# Patient Record
Sex: Female | Born: 1955 | Race: White | Hispanic: Yes | Marital: Married | Smoking: Never smoker
Health system: Southern US, Community
[De-identification: ages and names within clinical notes are randomized; demographics above are authoritative.]

## PROBLEM LIST (undated history)

## (undated) DIAGNOSIS — D649 Anemia, unspecified: Secondary | ICD-10-CM

## (undated) DIAGNOSIS — M1990 Unspecified osteoarthritis, unspecified site: Secondary | ICD-10-CM

## (undated) DIAGNOSIS — I10 Essential (primary) hypertension: Secondary | ICD-10-CM

## (undated) DIAGNOSIS — Z86010 Personal history of colonic polyps: Secondary | ICD-10-CM

## (undated) DIAGNOSIS — Z5189 Encounter for other specified aftercare: Secondary | ICD-10-CM

## (undated) DIAGNOSIS — E785 Hyperlipidemia, unspecified: Secondary | ICD-10-CM

## (undated) DIAGNOSIS — M810 Age-related osteoporosis without current pathological fracture: Secondary | ICD-10-CM

## (undated) DIAGNOSIS — K649 Unspecified hemorrhoids: Secondary | ICD-10-CM

## (undated) HISTORY — DX: Hyperlipidemia, unspecified: E78.5

## (undated) HISTORY — PX: COLONOSCOPY: SHX174

## (undated) HISTORY — DX: Encounter for other specified aftercare: Z51.89

## (undated) HISTORY — DX: Essential (primary) hypertension: I10

## (undated) HISTORY — DX: Personal history of colonic polyps: Z86.010

## (undated) HISTORY — PX: POLYPECTOMY: SHX149

## (undated) HISTORY — DX: Unspecified osteoarthritis, unspecified site: M19.90

## (undated) HISTORY — DX: Age-related osteoporosis without current pathological fracture: M81.0

## (undated) HISTORY — DX: Anemia, unspecified: D64.9

## (undated) HISTORY — DX: Unspecified hemorrhoids: K64.9

---

## 1993-11-06 HISTORY — PX: TUBAL LIGATION: SHX77

## 2007-11-07 HISTORY — PX: VAGINAL HYSTERECTOMY: SUR661

## 2008-01-20 ENCOUNTER — Inpatient Hospital Stay (HOSPITAL_COMMUNITY): Admission: EM | Admit: 2008-01-20 | Discharge: 2008-01-21 | Payer: Self-pay | Admitting: Emergency Medicine

## 2008-02-18 ENCOUNTER — Encounter: Admission: RE | Admit: 2008-02-18 | Discharge: 2008-02-18 | Payer: Self-pay | Admitting: Obstetrics and Gynecology

## 2008-03-11 ENCOUNTER — Encounter (INDEPENDENT_AMBULATORY_CARE_PROVIDER_SITE_OTHER): Payer: Self-pay | Admitting: Obstetrics and Gynecology

## 2008-03-11 ENCOUNTER — Inpatient Hospital Stay (HOSPITAL_COMMUNITY): Admission: RE | Admit: 2008-03-11 | Discharge: 2008-03-12 | Payer: Self-pay | Admitting: Obstetrics and Gynecology

## 2008-11-06 HISTORY — PX: WISDOM TOOTH EXTRACTION: SHX21

## 2009-02-08 ENCOUNTER — Encounter: Admission: RE | Admit: 2009-02-08 | Discharge: 2009-02-08 | Payer: Self-pay | Admitting: Obstetrics and Gynecology

## 2009-11-06 HISTORY — PX: KNEE ARTHROSCOPY: SHX127

## 2010-09-22 ENCOUNTER — Ambulatory Visit (HOSPITAL_COMMUNITY): Admission: RE | Admit: 2010-09-22 | Discharge: 2010-09-22 | Payer: Self-pay | Admitting: Orthopedic Surgery

## 2010-11-06 HISTORY — PX: KNEE ARTHROSCOPY: SUR90

## 2010-11-28 ENCOUNTER — Encounter
Admission: RE | Admit: 2010-11-28 | Discharge: 2010-11-28 | Payer: Self-pay | Source: Home / Self Care | Attending: Obstetrics and Gynecology | Admitting: Obstetrics and Gynecology

## 2010-11-28 ENCOUNTER — Encounter (INDEPENDENT_AMBULATORY_CARE_PROVIDER_SITE_OTHER): Payer: Self-pay | Admitting: *Deleted

## 2010-12-02 ENCOUNTER — Encounter
Admission: RE | Admit: 2010-12-02 | Discharge: 2010-12-02 | Payer: Self-pay | Source: Home / Self Care | Attending: Obstetrics and Gynecology | Admitting: Obstetrics and Gynecology

## 2010-12-08 NOTE — Letter (Signed)
Summary: Pre Visit Letter Revised  [facility]  [address] [place], [place] [address]   Phone: [phone]  Fax: [phone]        11/28/2010 MRN: [id number] [name] [address] [place] [address]                Procedure Date:  12/30/2010 @ 3:00   Direct colon-Dr. Leone Payor   Welcome to the Gastroenterology Division at [facility].    You are scheduled to see a nurse for your pre-procedure visit on 12/16/2010 at 3:30 on the 3rd floor at [facility], [address]  We ask that you try to arrive at our office 15 minutes prior to your appointment time to allow for check-in.  Please take a minute to review the attached form.  If you answer "Yes" to one or more of the questions on the first page, we ask that you call the person listed at your earliest opportunity.  If you answer "No" to all of the questions, please complete the rest of the form and bring it to your appointment.    Your nurse visit will consist of discussing your medical and surgical history, your immediate family medical history, and your medications.   If you are unable to list all of your medications on the form, please bring the medication bottles to your appointment and we will list them.  We will need to be aware of both prescribed and over the counter drugs.  We will need to know exact dosage information as well.    Please be prepared to read and sign documents such as consent forms, a financial agreement, and acknowledgement forms.  If necessary, and with your consent, a friend or relative is welcome to sit-in on the nurse visit with you.  Please bring your insurance card so that we may make a copy of it.  If your insurance requires a referral to see a specialist, please bring your referral form from your primary care physician.  No co-pay is required for this nurse visit.     If you cannot keep your appointment, please call [phone] to cancel or reschedule prior to  your appointment date.  This allows [place] the opportunity to schedule an appointment for another patient in need of care.    Thank you for choosing [facility] for your medical needs.  We appreciate the opportunity to care for you.  Please visit [place] at our website  to learn more about our practice.  Sincerely, The Gastroenterology Division

## 2010-12-08 NOTE — Letter (Signed)
Summary: Pre Visit Letter Revised  [facility]  [address] [place], [place] [address]   Phone: [phone]  Fax: [phone]        11/28/2010 MRN: [id number] [name]               Procedure Date:  12/30/2010 @ 3:00   Direct colon-Dr. Leone Payor   Welcome to the Gastroenterology Division at [facility].    You are scheduled to see a nurse for your pre-procedure visit on 12/16/2010 at 3:30 on the 3rd floor at [facility], [address].  We ask that you try to arrive at our office 15 minutes prior to your appointment time to allow for check-in.  Please take a minute to review the attached form.  If you answer "Yes" to one or more of the questions on the first page, we ask that you call the person listed at your earliest opportunity.  If you answer "No" to all of the questions, please complete the rest of the form and bring it to your appointment.    Your nurse visit will consist of discussing your medical and surgical history, your immediate family medical history, and your medications.   If you are unable to list all of your medications on the form, please bring the medication bottles to your appointment and we will list them.  We will need to be aware of both prescribed and over the counter drugs.  We will need to know exact dosage information as well.    Please be prepared to read and sign documents such as consent forms, a financial agreement, and acknowledgement forms.  If necessary, and with your consent, a friend or relative is welcome to sit-in on the nurse visit with you.  Please bring your insurance card so that we may make a copy of it.  If your insurance requires a referral to see a specialist, please bring your referral form from your primary care physician.  No co-pay is required for this nurse visit.     If you cannot keep your appointment, please call [phone] to cancel or reschedule prior to your appointment date.  This allows [place] the  opportunity to schedule an appointment for another patient in need of care.    Thank you for choosing [facility] for your medical needs.  We appreciate the opportunity to care for you.  Please visit [place] at our website  to learn more about our practice.  Sincerely, The Gastroenterology Division

## 2010-12-14 ENCOUNTER — Encounter (INDEPENDENT_AMBULATORY_CARE_PROVIDER_SITE_OTHER): Payer: Self-pay | Admitting: *Deleted

## 2010-12-16 ENCOUNTER — Encounter: Payer: Self-pay | Admitting: Internal Medicine

## 2010-12-22 NOTE — Miscellaneous (Signed)
Summary: [facility] PV.Marland Kitchen  Clinical Lists Changes  Medications: Added new medication of MOVIPREP 100 GM  SOLR (PEG-KCL-NACL-NASULF-NA ASC-C) As per prep instructions. - Signed Rx of MOVIPREP 100 GM  SOLR (PEG-KCL-NACL-NASULF-NA ASC-C) As per prep instructions.;  #1 x 0;  Signed;  Entered by: Ezra Sites RN;  Authorized by: Iva Boop MD, Clementeen Graham;  Method used: Electronically to [facility]*, [address], [place], [place]  [address], Ph: [phone], Fax: [phone] Allergies: Added new allergy or adverse reaction of MORPHINE Added new allergy or adverse reaction of * LISINOPRIL Observations: Added new observation of NKA: F (12/16/2010 15:08)    Prescriptions: MOVIPREP 100 GM  SOLR (PEG-KCL-NACL-NASULF-NA ASC-C) As per prep instructions.  #1 x 0   Entered by:   Ezra Sites RN   Authorized by:   Iva Boop MD, [facility]   Signed by:   Ezra Sites RN on 12/16/2010   Method used:   Electronically to        [facility]* (retail)       [address]       [address]       [place], [place]  [address]       Ph: [phone]       Fax: [phone]   RxID:   [id number]

## 2010-12-22 NOTE — Letter (Signed)
Summary: Moviprep Instructions  [facility]  [address]   [place], [place] [address]   Phone: [phone]  Fax: [phone]       [name]    1956/04/04    MRN: [id number]        Procedure Day Dorna Bloom: Friday, 12-30-10     Arrival Time: 2:00 p.m.     Procedure Time: 3:00 p.m.     Location of Procedure:                    x [facility] (4th Floor)   PREPARATION FOR COLONOSCOPY WITH MOVIPREP   Starting 5 days prior to your procedure 12-25-10 do not eat nuts, seeds, popcorn, corn, beans, peas,  salads, or any raw vegetables.  Do not take any fiber supplements (e.g. Metamucil, Citrucel, and Benefiber).  THE DAY BEFORE YOUR PROCEDURE         DATE: 12-29-10  DAY: Thursday  1.  Drink clear liquids the entire day-NO SOLID FOOD  2.  Do not drink anything colored red or purple.  Avoid juices with pulp.  No orange juice.  3.  Drink at least 64 oz. (8 glasses) of fluid/clear liquids during the day to prevent dehydration and help the prep work efficiently.  CLEAR LIQUIDS INCLUDE: Water Jello Ice Popsicles Tea (sugar ok, no milk/cream) Powdered fruit flavored drinks Coffee (sugar ok, no milk/cream) Gatorade Juice: apple, white grape, white cranberry  Lemonade Clear bullion, consomm, broth Carbonated beverages (any kind) Strained chicken noodle soup Hard Candy                             4.  In the morning, mix first dose of MoviPrep solution:    Empty 1 Pouch A and 1 Pouch B into the disposable container    Add lukewarm drinking water to the top line of the container. Mix to dissolve    Refrigerate (mixed solution should be used within 24 hrs)  5.  Begin drinking the prep at 5:00 p.m. The MoviPrep container is divided by 4 marks.   Every 15 minutes drink the solution down to the next mark (approximately 8 oz) until the full liter is complete.   6.  Follow completed prep with 16 oz of clear liquid of your choice (Nothing red or purple).   Continue to drink clear liquids until bedtime.  7.  Before going to bed, mix second dose of MoviPrep solution:    Empty 1 Pouch A and 1 Pouch B into the disposable container    Add lukewarm drinking water to the top line of the container. Mix to dissolve    Refrigerate  THE DAY OF YOUR PROCEDURE      DATE: 12-30-10  DAY: Friday  Beginning at 10:00 a.m. (5 hours before procedure):         1. Every 15 minutes, drink the solution down to the next mark (approx 8 oz) until the full liter is complete.  2. Follow completed prep with 16 oz. of clear liquid of your choice.    3. You may drink clear liquids until 1:00 p.m. (2 HOURS BEFORE PROCEDURE).   MEDICATION INSTRUCTIONS  Unless otherwise instructed, you should take regular prescription medications with a small sip of water   as early as possible the morning of your procedure.         OTHER INSTRUCTIONS  You will need a responsible adult at least 55  years of age to accompany you and drive you home.   This person must remain in the waiting room during your procedure.  Wear loose fitting clothing that is easily removed.  Leave jewelry and other valuables at home.  However, you may wish to bring a book to read or  an iPod/MP3 player to listen to music as you wait for your procedure to start.  Remove all body piercing jewelry and leave at home.  Total time from sign-in until discharge is approximately 2-3 hours.  You should go home directly after your procedure and rest.  You can resume normal activities the  day after your procedure.  The day of your procedure you should not:   Drive   Make legal decisions   Operate machinery   Drink alcohol   Return to work  You will receive specific instructions about eating, activities and medications before you leave.    The above instructions have been reviewed and explained to me by   Ezra Sites RN  December 16, 2010 3:38 PM    I fully understand and can verbalize  these instructions _____________________________ Date _________

## 2010-12-30 ENCOUNTER — Other Ambulatory Visit (AMBULATORY_SURGERY_CENTER): Payer: Commercial Managed Care - PPO | Admitting: Internal Medicine

## 2010-12-30 ENCOUNTER — Other Ambulatory Visit: Payer: Self-pay | Admitting: Internal Medicine

## 2010-12-30 ENCOUNTER — Encounter: Payer: Self-pay | Admitting: Internal Medicine

## 2010-12-30 DIAGNOSIS — Z86010 Personal history of colonic polyps: Secondary | ICD-10-CM

## 2010-12-30 DIAGNOSIS — D126 Benign neoplasm of colon, unspecified: Secondary | ICD-10-CM

## 2010-12-30 DIAGNOSIS — Z1211 Encounter for screening for malignant neoplasm of colon: Secondary | ICD-10-CM

## 2010-12-30 HISTORY — DX: Personal history of colonic polyps: Z86.010

## 2011-01-03 NOTE — Procedures (Addendum)
Summary: Colonoscopy  Patient: [name] Note: All result statuses are Final unless otherwise noted.  Tests: (1) Colonoscopy (COL)   COL Colonoscopy           DONE     [facility]     [address]     [place], [place]  [address]           COLONOSCOPY PROCEDURE REPORT           PATIENT:  [name], [name]  MR#:  [id number]     BIRTHDATE:  Sep 28, 1956, 54 yrs. old  GENDER:  female     ENDOSCOPIST:  Iva Boop, MD, [facility]     REF. BY:  Jarome Matin, M.D.     PROCEDURE DATE:  12/30/2010     PROCEDURE:  Colonoscopy with biopsy and snare polypectomy     ASA CLASS:  Class II     INDICATIONS:  Routine Risk Screening     MEDICATIONS:   Fentanyl 50 mcg IV, Versed 8 mg IV           DESCRIPTION OF PROCEDURE:   After the risks benefits and     alternatives of the procedure were thoroughly explained, informed     consent was obtained.  Digital rectal exam was performed and     revealed no abnormalities.   The LB PCF-[id number] endoscope     was introduced through the anus and advanced to the cecum, which     was identified by both the appendix and ileocecal valve, without     limitations.  The quality of the prep was excellent, using     MoviPrep.  The instrument was then slowly withdrawn as the colon     was fully examined. Insertion: 2:52 minutes Withdrawal: 16:32     minutes     <<PROCEDUREIMAGES>>           FINDINGS:  Three polyps were found. Ileocecal valve (2mm, cold     biopsy removal), ascending colon (8-10 mm, snare cautery removal),     sigmoid (4mm, cold snare removal). All sent to pathology.  This     was otherwise a normal examination of the colon.   Retroflexed     views in the rectum revealed internal and external hemorrhoids.     The scope was then withdrawn from the patient and the procedure     completed.           COMPLICATIONS:  None     ENDOSCOPIC IMPRESSION:     1) Three polyps removed, largest 8-10 mm.     2) Internal and external hemorrhoids   3) Otherwise normal examination with excellent prep     RECOMMENDATIONS:     1) Hold aspirin, aspirin products, and anti-inflammatory     medication for 2 weeks.     REPEAT EXAM:  In for Colonoscopy, pending biopsy  results.           Iva Boop, MD, Clementeen Graham           CC:  Jarome Matin, MD     The Patient           n.     eSIGNED:   Iva Boop at 12/30/2010 04:06 PM           Carp, [place], [id number]  Note: An exclamation mark (!) indicates a result that was not dispersed into the flowsheet. Document Creation Date: 12/30/2010 4:07 PM _______________________________________________________________________  (1) Order result  status: Final Collection or observation date-time: 12/30/2010 15:58 Requested date-time:  Receipt date-time:  Reported date-time:  Referring Physician:   Ordering Physician: Stan Head [phone]) Specimen Source:  Source: Launa Grill Order Number: [id number] Lab site:   Appended Document: Colonoscopy     Procedures Next Due Date:    Colonoscopy: 01/2016

## 2011-01-06 ENCOUNTER — Encounter: Payer: Self-pay | Admitting: Internal Medicine

## 2011-01-12 ENCOUNTER — Encounter (HOSPITAL_BASED_OUTPATIENT_CLINIC_OR_DEPARTMENT_OTHER)
Admission: RE | Admit: 2011-01-12 | Discharge: 2011-01-12 | Disposition: A | Discharge status: Home / Self Care | Payer: Commercial Managed Care - PPO | Source: Ambulatory Visit | Attending: Orthopedic Surgery | Admitting: Orthopedic Surgery

## 2011-01-12 LAB — BASIC METABOLIC PANEL
CO2: 29 mEq/L (ref 19–32)
Calcium: 9.5 mg/dL (ref 8.4–10.5)
Chloride: 102 mEq/L (ref 96–112)
Creatinine, Ser: 0.67 mg/dL (ref 0.4–1.2)
GFR calc Af Amer: >60 mL/min (ref >60)
Sodium: 138 mEq/L (ref 135–145)

## 2011-01-12 NOTE — Letter (Signed)
Summary: Patient Notice- Polyp Results  [facility]  [address] [place], [place] [address]   Phone: [phone]  Fax: [phone]        January 06, 2011 MRN: [id number]    [name] [address] [place], [place]  [address]    Dear Ms. Zimmerle,  The polyps removed from your colon were adenomatous. This means that they were pre-cancerous or that  they had the potential to change into cancer over time.  I recommend that you have a repeat colonoscopy in 5 years to determine if you have developed any new polyps over time and screen for colorectal cancer. If you develop any new rectal bleeding, abdominal pain or significant bowel habit changes, please contact us before then.  In addition to repeating colonoscopy, changing health habits may reduce your risk of having more colon or rectal  polyps and possibly, colorectal cancer. You may lower your risk of future polyps and colorectal cancer by adopting healthy habits such as not smoking or using tobacco (if you do), being physically active, losing weight (if overweight), and eating a diet which includes fruits and vegetables and limits red meat.  Please call us if you are having persistent problems or have questions about your condition that have not been fully answered at this time.  Sincerely,  Iva Boop MD, [facility]  This letter has been electronically signed by your physician.  Appended Document: Patient Notice- Polyp Results letter mailed

## 2011-01-17 ENCOUNTER — Ambulatory Visit (HOSPITAL_BASED_OUTPATIENT_CLINIC_OR_DEPARTMENT_OTHER)
Admission: RE | Admit: 2011-01-17 | Discharge: 2011-01-17 | Disposition: A | Discharge status: Home / Self Care | Payer: Commercial Managed Care - PPO | Source: Ambulatory Visit | Attending: Orthopedic Surgery | Admitting: Orthopedic Surgery

## 2011-01-17 DIAGNOSIS — Z01812 Encounter for preprocedural laboratory examination: Secondary | ICD-10-CM | POA: Insufficient documentation

## 2011-01-17 DIAGNOSIS — M2240 Chondromalacia patellae, unspecified knee: Secondary | ICD-10-CM | POA: Insufficient documentation

## 2011-01-17 DIAGNOSIS — M23329 Other meniscus derangements, posterior horn of medial meniscus, unspecified knee: Secondary | ICD-10-CM | POA: Insufficient documentation

## 2011-01-17 DIAGNOSIS — M23302 Other meniscus derangements, unspecified lateral meniscus, unspecified knee: Secondary | ICD-10-CM | POA: Insufficient documentation

## 2011-01-17 DIAGNOSIS — Z01818 Encounter for other preprocedural examination: Secondary | ICD-10-CM | POA: Insufficient documentation

## 2011-01-17 DIAGNOSIS — G5760 Lesion of plantar nerve, unspecified lower limb: Secondary | ICD-10-CM | POA: Insufficient documentation

## 2011-01-18 LAB — POCT HEMOGLOBIN-HEMACUE: Hemoglobin: 14.6 g/dL (ref 12.0–15.0)

## 2011-01-30 NOTE — Op Note (Addendum)
[name], [name]                 ACCOUNT NO.:  [id number]  MEDICAL RECORD NO.:  [id number]          PATIENT TYPE:  OUT  LOCATION:  MRI                          FACILITY:  [facility]  PHYSICIAN:  Elana Alm. Thurston Hole, M.D. DATE OF BIRTH:  1956-03-15  DATE OF PROCEDURE:  01/17/2011 DATE OF DISCHARGE:  09/22/2010                              OPERATIVE REPORT   PREOPERATIVE DIAGNOSES: 1. 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 2. 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  POSTOPERATIVE DIAGNOSES: 1. 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 2. 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  PROCEDURES: 1. Left knee EUA followed by arthroscopic partial medial and lateral     meniscectomies. 2. Left knee chondroplasty. 3. Left foot second web space Morton neuroma cortisone injection.  SURGEON:  Elana Alm. Thurston Hole, MD  ASSISTANT:  Julien Girt, PA-C  ANESTHESIA:  Local and MAC.  OPERATIVE TIME:  30 minutes.  COMPLICATIONS:  None.  INDICATIONS FOR PROCEDURE:  [name] is a 54-year woman who has had 6- 8 months of increasing left knee pain and left foot pain with exam and MRI of the left knee consistent with medial and lateral meniscal tears with chondromalacia and exam consistent with left foot second web space Morton's neuroma.  She has failed conservative care and is now to undergo left knee arthroscopy and left foot Morton's neuroma injection.  DESCRIPTION:  [name] was brought to the operating room on January 17, 2011, after a left knee block was placed in order by Anesthesia.  She was placed on the operative table in supine position.  She received Ancef 1 g IV preoperatively for prophylaxis.  Her left foot was examined and she had the second web space injected with 40 mg of Depo-Medrol and 1 mL of 0.25% Marcaine under sterile conditions and tolerated this well. The left knee was examined under anesthesia.  She had full range  of motion.  Knee was stable ligamentous exam with normal patellar tracking. The left leg was prepped using sterile DuraPrep and draped using sterile technique.  Time-out procedure was called and the correct left knee and left foot identified.  Initially through an anterolateral portal, the arthroscope with a pump attached was placed into an anteromedial portal. An arthroscopic probe was placed.  On initial inspection of the medial compartment, the articular cartilage showed only grade 1 and two chondromalacia.  Medial meniscus showed a tear of the posterior medial horn of which 30% was resected back to a stable rim.  Intercondylar notch inspected.  Anterior and posterior cruciate ligaments were normal. Lateral compartment inspected.  She had 30-40% grade 3 chondromalacia on the lateral tibial plateau which was debrided.  Lateral femoral condyle was intact.  The lateral meniscus showed a tear of the lateral horn of which 30% was resected back to a stable rim.  Patellofemoral joint articular cartilage was intact.  Patella tracked normally.  Medial and lateral gutters were free of pathology.  After this was done, it was felt that all pathology had been satisfactorily addressed.  The instruments were removed.  Portals closed  with 3-0 nylon suture. Sterile dressings were applied and the patient awakened and taken to the recovery in stable condition.  FOLLOWUP CARE:  Ms. Babin will be followed as an outpatient on Norco and ibuprofen.  She may see [place] in office in a week for sutures out and follow up.     Montrell Cessna A. Thurston Hole, M.D.     RAW/MEDQ  D:  01/17/2011  T:  01/18/2011  Job:  [id number]  Electronically Signed by Salvatore Marvel M.D. on 01/30/2011 01:35:35 PM

## 2011-03-20 ENCOUNTER — Encounter: Payer: Commercial Managed Care - PPO | Attending: Surgery | Admitting: *Deleted

## 2011-03-20 DIAGNOSIS — Z713 Dietary counseling and surveillance: Secondary | ICD-10-CM | POA: Insufficient documentation

## 2011-03-20 DIAGNOSIS — E789 Disorder of lipoprotein metabolism, unspecified: Secondary | ICD-10-CM | POA: Insufficient documentation

## 2011-03-20 DIAGNOSIS — I10 Essential (primary) hypertension: Secondary | ICD-10-CM | POA: Insufficient documentation

## 2011-03-21 NOTE — Discharge Summary (Signed)
[name], Price                 ACCOUNT NO.:  [id number]   MEDICAL RECORD NO.:  [id number]          PATIENT TYPE:  INP   LOCATION:  4706                         FACILITY:  [facility]   PHYSICIAN:  Michaelyn Barter, M.D. DATE OF BIRTH:  16-Feb-1956   DATE OF ADMISSION:  01/20/2008  DATE OF DISCHARGE:  01/21/2008                               DISCHARGE SUMMARY   FINAL DIAGNOSES:  1. Syncope.  2. Severe acute blood loss anemia.  3. Excessive vaginal bleeding.   PROCEDURES:  1. Transfusion of 4 units of packed red blood cells.  2. Ultrasound.   HISTORY OF PRESENT ILLNESS:  [name] is a 55 year old female who  states that she has a history of heavy vaginal bleeding, however,  recently since she started her period a few days ago, the amount of  blood that she has been passing has been larger than that in the past.  She indicated that approximately 3 days ago, she started her period and  had been bleeding to the point where about she was utilizing at least 1  pad per hour.  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  She passed out before any one could provide her a chair to  sit in.  Her syncopal episode lasted for approximately 5 to 10 minutes.  She was later discovered to have a blood pressure of 88/74 and was  subsequently brought to the emergency department for evaluation.   PAST MEDICAL HISTORY:  Please see that dictated by Dr. Michaelyn Barter.   HOSPITAL COURSE:  1. Syncope.  The etiology of the patient's syncopal episode is most      likely the profoundly low blood pressure.  The patient was      transfused 4 units of blood during her hospital course.  She had no      repeat episodes of syncope nor does she complain of feeling      lightheaded.  2. Severe profound anemia.  The patient arrived and was discovered to      has had a hemoglobin of 5.4.  She was transfused 4 units of  packed      red blood cells.  The source of her anemia appeared to be      menorrhagia possibly secondary to a fibroid tumor.  3. Excessive vaginal bleeding.  The patient again indicated that she      has a history of excessive bleeding, however, during her most      recent period, the bleeding was pronounced.  An ultrasound was      completed, which revealed a 4-cm central fibroid, which was likely      the source of the uterine bleeding.  A small complex cyst involving      the left ovary was also noted.  The ER physician Dr. Oletta Lamas called      Gynecology.  Dr. Cherly Hensen called me and then indicated that she      would like for the patient to be admitted  to the medicine service      so that she could receive a blood transfusion.  She then indicated      that it would be okay to discharge the patient, but have the      patient followup with herself or another gynecologist.  On the date      of discharge, the patient indicates that she feels significantly      better.  Her vitals, temperature is 98.0, heart rate 72,      respirations 18, blood pressure 119/77, O2 sat is 97%-100% on room      air.  She states that she has been walking around and she does not      feel lightheaded.      She has requested to be discharged home.  Therefore, the patient      will be discharged as requested.  She will be instructed to call      her gynecologist within 1 week for followup appointment and to have      her hemoglobin rechecked soon.      Michaelyn Barter, M.D.  Electronically Signed     OR/MEDQ  D:  01/21/2008  T:  01/22/2008  Job:  [id number]

## 2011-03-21 NOTE — Op Note (Signed)
NAMEARIETTA, EISENSTEIN                 ACCOUNT NO.:  [id number]   MEDICAL RECORD NO.:  [id number]          PATIENT TYPE:  INP   LOCATION:  9308                          FACILITY:  [facility]   PHYSICIAN:  Michelle L. Grewal, M.D.DATE OF BIRTH:  01-Jan-1956   DATE OF PROCEDURE:  03/11/2008  DATE OF DISCHARGE:                               OPERATIVE REPORT   PREOPERATIVE DIAGNOSES:  Menorrhagia and fibroids.   POSTOPERATIVE DIAGNOSES:  Menorrhagia and fibroids.   PROCEDURE:  Laparoscopic-assisted vaginal hysterectomy.   SURGEON:  Michelle L. Vincente Poli, MD   ASSISTANT:  Guy Sandifer. Henderson Cloud, MD   ANESTHESIA:  General.   ESTIMATED BLOOD LOSS:  300 mL.   PATHOLOGY:  Uterus and cervix.   DRAINS:  Foley catheter.   PROCEDURE:  After the patient was given informed consent, she was taken  to the operating room.  She was intubated without difficulty.  She was  then prepped and draped in the usual sterile fashion.  An uterine  manipulator was inserted and a Foley catheter was inserted for draining  clear urine.  Attention was turned to the abdomen.  Local was  infiltrated at the umbilicus and suprapubically.  A small scalpel was  used to make a small infraumbilical incision.  A Veress needle was  inserted and pneumoperitoneum was performed.  The Veress needle was  removed.  An 11-mm trocar was inserted and the patient was gently placed  in Trendelenburg position, and the laparoscope was introduced into the  abdominal cavity.  Everything appeared normal. A secondary 5-mm trocar  was placed suprapubically under direct visualization.  The uterus was of  course enlarged, was approximately 12 weeks' size, and myomatous.  Ovaries appeared normal.  The patient had desired to have her ovaries  not to be removed, as they appeared normal and they did appear normal.  There was no evidence of endometriosis and no adhesions.  She of course  had evidence of status post surgical tubal ligation bilaterally.  Using  an atraumatic grasper, the ovary on the right side was then elevated.  I  identified the ureter, which was below the level we were and then placed  a Gyrus instrument across the triple pedicle, burned that, transected,  and carried it down to the round ligament and developed the bladder  flap.  This was done with excellent hemostasis.  This was then done on  the left side in the identical fashion.  After we performed this portion  of the surgery, I then removed distal laparoscope, released the  pneumoperitoneum, and placed a weighted speculum in the vagina.  A  circumferential incision was made around the cervix.  The posterior cul-  de-sac was entered sharply using Mayo scissors.  The anterior cul-de-sac  was entered sharply using Mayo scissors.  Using the curved Heaney  clamps, the uterosacral cardinal ligaments were then clamped on each  side.  Each pedicle was suture ligated using 0 Vicryl suture.  We walked  our way up the broad ligament staying close beside the cervix and the  uterus.  Each pedicle was suture ligated.  The uterus was then removed.  The posterior cuff was closed in a running stitch from 3 to 9 o'clock  using 0 Vicryl in running locked stitch and then the cuff was closed  completely in a running stitch using 0 Vicryl suture.  Hemostasis was  excellent.  At this point, I went back up to the abdomen, replaced the  pneumoperitoneum, irrigated the pelvis.  All pedicles were hemostatic.  The vaginal cuff was dry.  The pneumoperitoneum was released.  The  trocars were removed.  The incisions were closed with Dermabond skin  adhesive.  All sponge, lap, and instrument counts were correct x2.  The  patient was extubated and went to the recovery room in stable condition.      Michelle L. Vincente Poli, M.D.  Electronically Signed     MLG/MEDQ  D:  03/11/2008  T:  03/11/2008  Job:  [id number]

## 2011-03-21 NOTE — H&P (Signed)
NAMEAALIYAN, BRINKMEIER                 ACCOUNT NO.:  [id number]   MEDICAL RECORD NO.:  [id number]           PATIENT TYPE:   LOCATION:                                 FACILITY:   PHYSICIAN:  Michaelyn Barter, M.D.      DATE OF BIRTH:   DATE OF ADMISSION:  01/20/2008  DATE OF DISCHARGE:                              HISTORY & PHYSICAL   PRIMARY CARE PHYSICIAN:  Unassigned.   CHIEF COMPLAINT:  I passed out.   HISTORY OF PRESENT ILLNESS:  [name] is a 55 year old female who  states that she has a history of heavy vaginal bleeding and has had this  for quite some time.  However, recently the number of days during which  her period occurs has been increasing. This past Friday she developed  heavy vaginal bleeding to the point that she had to change her pad every  hour.  She felt fine up until Saturday, and last night her husband  states that she indicated that she did not feel too good. However, she  had no specific complaints.  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 Subsequently, she ended up passing out.  She states  that she was out for at least 5-10 minutes. She was taken to the PACU.  Her vital signs were checked, and she was told that her vital signs were  fine. However, EMS transported her from her place of employment to the  hospital, and, during that time, she was told that her blood pressure  was 88/74. She also was told that her hemoglobin was 5.2.  She currently  denies having any shortness of breath or chest pain.   PAST MEDICAL HISTORY:  1. Syncopal episode. This occurred approximately 3 years ago.  The      issues surrounding the event are questionable.  2. History of heavy menstrual bleeding.  3. Hypertension.  The patient has been treated with diet and exercise.   PAST SURGICAL HISTORY:  To flow ligation approximately 13 years ago.   ALLERGIES:  None.   HOME MEDICATIONS:   None.   SOCIAL HISTORY:  Cigarettes:  Denies.  Alcohol:  Denies.   FAMILY HISTORY:  Mother has a history of hypertension.  Father had a  heart attack at age of 101.   REVIEW OF SYSTEMS:  As per HPI. Otherwise, all other systems are  negative.   PHYSICAL EXAMINATION:  GENERAL:  The patient is awake.  She is  cooperative.  She is in no obvious distress.  VITAL SIGNS:  Temperature is 98.0, blood pressure 92/60, heart rate 72,  respirations 20, O2 saturation 100% on room air.  HEENT:  Normocephalic, atraumatic.  Anicteric. Extraocular movements are  intact . Oral mucosa is pink.  No thrush, no exudates.  NECK:  Supple.  No JVD, no lymphadenopathy.  CARDIAC:  S1-S2 present.  Regular rate and rhythm.  RESPIRATORY:  No crackles or wheezes.  ABDOMEN:  Flat, soft, nontender, nondistended.  Positive bowel sounds  x4  quadrants.  EXTREMITIES:  No leg edema.  NEUROLOGIC:  The patient is alert and oriented x3.  MUSCULOSKELETAL:  5/5 upper and lower extremity strength.   LABORATORY DATA:  White blood cell count is 2.6, hemoglobin 5.4,  hematocrit 17.3, platelet count 266. Sodium 140, potassium 4.0, chloride  109, CO2 23, glucose 99, BUN 13, creatinine 0.46, calcium 8.6. PTT 23,  PT 14.3, INR 1.1.   The ultrasound was completed which reveals a 4 cm central fibroid. This  is likely the source of the uterine bleed. Also noted is a small complex  cyst involving the left ovary   ASSESSMENT AND PLAN:  1. Syncopal episode. This is most likely secondary to the severe      anemia that the patient presents with and the most likely      underlying etiology of that is the heavy menstrual bleeding that      the patient currently is demonstrating. Will transfuse the patient      at least 4 units of packed red blood cells. Gynecology has been      consulted. Dr. Cherly Hensen has agreed to see the patient once she is      discharged from the hospital.  Will monitor the patient's      hemoglobin and hematocrit  closely for now.  2. Hypotension.  This is most likely secondary to the acute blood loss      anemia that the patient presents with. Will provide the patient      with IV fluid hydration as well as transfuse several units of      packed red blood cells.  3. Gastrointestinal prophylaxis. Will provide Protonix.      Michaelyn Barter, M.D.  Electronically Signed     OR/MEDQ  D:  01/20/2008  T:  01/20/2008  Job:  [id number]

## 2011-04-25 ENCOUNTER — Encounter: Payer: Commercial Managed Care - PPO | Attending: Surgery | Admitting: *Deleted

## 2011-04-25 DIAGNOSIS — E789 Disorder of lipoprotein metabolism, unspecified: Secondary | ICD-10-CM | POA: Insufficient documentation

## 2011-04-25 DIAGNOSIS — I10 Essential (primary) hypertension: Secondary | ICD-10-CM | POA: Insufficient documentation

## 2011-04-25 DIAGNOSIS — Z713 Dietary counseling and surveillance: Secondary | ICD-10-CM | POA: Insufficient documentation

## 2011-05-30 ENCOUNTER — Encounter: Payer: 59 | Attending: Surgery | Admitting: *Deleted

## 2011-05-30 ENCOUNTER — Encounter: Payer: Self-pay | Admitting: *Deleted

## 2011-05-30 DIAGNOSIS — Z713 Dietary counseling and surveillance: Secondary | ICD-10-CM | POA: Insufficient documentation

## 2011-05-30 DIAGNOSIS — E789 Disorder of lipoprotein metabolism, unspecified: Secondary | ICD-10-CM | POA: Insufficient documentation

## 2011-05-30 DIAGNOSIS — I10 Essential (primary) hypertension: Secondary | ICD-10-CM | POA: Insufficient documentation

## 2011-05-30 NOTE — Progress Notes (Signed)
  Medical Nutrition Therapy:  Appt start time:  4:00p  End time:  4:30p.  Assessment:  Primary concerns today: Lipid abnormality - follow up.  Pt reports remodeling her house the last few weeks, so no walking. States her knee feels great and recent BP WNL at home.  Also reports more fatigue recently and eating more meals away from home.  Overall dietary intake and portions still satisfactory and pt maintained her weight from last visit, though she states she needs to start walking again to continue the weight loss. Pt inquired about adding flax seed back to diet.   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   Nutritional Diagnosis:  Shipman-2.2 Altered nutrition-related laboratory related to family history of hyperlipidemia as evidenced by elevated cholesterol in spite of healthy diet and consistent exercise.    Intervention/Goals:  Eat 3 meals/day, Avoid meal skipping   Increase protein rich foods  Follow "Plate Method" for portion control  Choose more whole grains, lean protein, low-fat dairy, and fruits/non-starchy vegetables.   Aim for >30 min of physical activity daily  Limit sugar-sweetened beverages, concentrated sweets, and foods high in fat and cholesterol.  Monitoring/Evaluation:  Dietary intake, exercise, lipid panel, and body weight prn.

## 2011-05-30 NOTE — Patient Instructions (Addendum)
Goals:  Eat 3 meals/day, Avoid meal skipping   Increase protein rich foods  Follow "Plate Method" for portion control  Choose more whole grains, lean protein, low-fat dairy, and fruits/non-starchy vegetables.   Aim for >30 min of physical activity daily  Limit sugar-sweetened beverages, concentrated sweets, and foods high in fat and cholesterol.  Aim for 8 mg of iron and 25-30 g of fiber.  Limit sodium intake to <2000 mg daily.

## 2011-05-31 ENCOUNTER — Encounter: Payer: Self-pay | Admitting: *Deleted

## 2011-07-31 LAB — DIFFERENTIAL
Basophils Absolute: 0
Basophils Relative: 0
Eosinophils Absolute: 0
Eosinophils Relative: 1
Lymphocytes Relative: 18
Lymphs Abs: 0.5 — ABNORMAL LOW
Monocytes Absolute: 0.2
Monocytes Relative: 6
Neutro Abs: 1.9
Neutrophils Relative %: 75

## 2011-07-31 LAB — CBC
HCT: 17.3 — ABNORMAL LOW
HCT: 29.5 — ABNORMAL LOW
Hemoglobin: 5.4 — CL
MCHC: 31.1
MCV: 67.6 — ABNORMAL LOW
MCV: 76.2 — ABNORMAL LOW
Platelets: 209
Platelets: 266
RBC: 2.56 — ABNORMAL LOW
RBC: 3.87
RDW: 20.3 — ABNORMAL HIGH
WBC: 2.6 — ABNORMAL LOW
WBC: 3.2 — ABNORMAL LOW

## 2011-07-31 LAB — BASIC METABOLIC PANEL WITH GFR
CO2: 23
Calcium: 8.6
Glucose, Bld: 99
Potassium: 4
Sodium: 140

## 2011-07-31 LAB — CROSSMATCH
ABO/RH(D): O POS
Antibody Screen: NEGATIVE

## 2011-07-31 LAB — IRON AND TIBC
Iron: <10 — ABNORMAL LOW
UIBC: 409

## 2011-07-31 LAB — RETICULOCYTES
RBC.: 2.68 — ABNORMAL LOW
Retic Count, Absolute: 32.2
Retic Ct Pct: 1.2

## 2011-07-31 LAB — FOLATE: Folate: >20

## 2011-07-31 LAB — BASIC METABOLIC PANEL
BUN: 13
BUN: 8
Chloride: 109
Chloride: 111
Creatinine, Ser: 0.46
GFR calc Af Amer: >60
GFR calc Af Amer: >60
GFR calc non Af Amer: >60
GFR calc non Af Amer: >60
Potassium: 3.8
Sodium: 142

## 2011-07-31 LAB — ABO/RH: ABO/RH(D): O POS

## 2011-07-31 LAB — VITAMIN B12: Vitamin B-12: 292 (ref 211–911)

## 2011-07-31 LAB — PROTIME-INR
INR: 1.1
Prothrombin Time: 14.3

## 2011-07-31 LAB — FERRITIN: Ferritin: 1 — ABNORMAL LOW (ref 10–291)

## 2011-07-31 LAB — APTT: aPTT: 23 — ABNORMAL LOW

## 2012-02-05 ENCOUNTER — Other Ambulatory Visit: Payer: Self-pay | Admitting: Obstetrics and Gynecology

## 2012-02-05 DIAGNOSIS — Z1231 Encounter for screening mammogram for malignant neoplasm of breast: Secondary | ICD-10-CM

## 2012-02-22 ENCOUNTER — Ambulatory Visit
Admission: RE | Admit: 2012-02-22 | Discharge: 2012-02-22 | Disposition: A | Discharge status: Home / Self Care | Payer: 59 | Source: Ambulatory Visit | Attending: Obstetrics and Gynecology | Admitting: Obstetrics and Gynecology

## 2012-02-22 DIAGNOSIS — Z1231 Encounter for screening mammogram for malignant neoplasm of breast: Secondary | ICD-10-CM

## 2012-05-14 ENCOUNTER — Other Ambulatory Visit: Payer: Self-pay | Admitting: Obstetrics and Gynecology

## 2013-03-03 ENCOUNTER — Other Ambulatory Visit: Payer: Self-pay

## 2013-03-03 DIAGNOSIS — Z1231 Encounter for screening mammogram for malignant neoplasm of breast: Secondary | ICD-10-CM

## 2013-03-20 ENCOUNTER — Ambulatory Visit
Admission: RE | Admit: 2013-03-20 | Discharge: 2013-03-20 | Disposition: A | Discharge status: Home / Self Care | Payer: 59 | Source: Ambulatory Visit

## 2013-03-20 DIAGNOSIS — Z1231 Encounter for screening mammogram for malignant neoplasm of breast: Secondary | ICD-10-CM

## 2013-07-08 ENCOUNTER — Other Ambulatory Visit: Payer: Self-pay | Admitting: Obstetrics and Gynecology

## 2013-10-27 ENCOUNTER — Encounter (HOSPITAL_BASED_OUTPATIENT_CLINIC_OR_DEPARTMENT_OTHER): Payer: Self-pay | Admitting: *Deleted

## 2013-10-28 ENCOUNTER — Encounter (HOSPITAL_BASED_OUTPATIENT_CLINIC_OR_DEPARTMENT_OTHER): Payer: Self-pay | Admitting: *Deleted

## 2013-10-28 NOTE — Progress Notes (Signed)
Pt works here-did ck with dr fitzgerald-does want bmet-ekg

## 2013-10-29 ENCOUNTER — Other Ambulatory Visit: Payer: Self-pay | Admitting: Orthopedic Surgery

## 2013-10-29 ENCOUNTER — Encounter (HOSPITAL_BASED_OUTPATIENT_CLINIC_OR_DEPARTMENT_OTHER)
Admission: RE | Admit: 2013-10-29 | Discharge: 2013-10-29 | Disposition: A | Discharge status: Home / Self Care | Payer: 59 | Source: Ambulatory Visit | Attending: Orthopedic Surgery | Admitting: Orthopedic Surgery

## 2013-10-29 DIAGNOSIS — Z01810 Encounter for preprocedural cardiovascular examination: Secondary | ICD-10-CM | POA: Insufficient documentation

## 2013-10-29 LAB — BASIC METABOLIC PANEL
CO2: 32 mEq/L (ref 19–32)
Calcium: 9.3 mg/dL (ref 8.4–10.5)
Chloride: 103 mEq/L (ref 96–112)
Creatinine, Ser: 1 mg/dL (ref 0.50–1.10)
Glucose, Bld: 106 mg/dL — ABNORMAL HIGH (ref 70–99)

## 2013-11-03 ENCOUNTER — Encounter (HOSPITAL_BASED_OUTPATIENT_CLINIC_OR_DEPARTMENT_OTHER): Payer: Self-pay | Admitting: Anesthesiology

## 2013-11-03 ENCOUNTER — Encounter (HOSPITAL_BASED_OUTPATIENT_CLINIC_OR_DEPARTMENT_OTHER): Payer: 59 | Admitting: Certified Registered"

## 2013-11-03 ENCOUNTER — Encounter (HOSPITAL_BASED_OUTPATIENT_CLINIC_OR_DEPARTMENT_OTHER)
Admission: RE | Disposition: A | Discharge status: Home / Self Care | Payer: Self-pay | Source: Ambulatory Visit | Attending: Orthopedic Surgery

## 2013-11-03 ENCOUNTER — Ambulatory Visit (HOSPITAL_BASED_OUTPATIENT_CLINIC_OR_DEPARTMENT_OTHER)
Admission: RE | Admit: 2013-11-03 | Discharge: 2013-11-03 | Disposition: A | Discharge status: Home / Self Care | Payer: 59 | Source: Ambulatory Visit | Attending: Orthopedic Surgery | Admitting: Orthopedic Surgery

## 2013-11-03 ENCOUNTER — Ambulatory Visit (HOSPITAL_BASED_OUTPATIENT_CLINIC_OR_DEPARTMENT_OTHER): Payer: 59 | Admitting: Certified Registered"

## 2013-11-03 DIAGNOSIS — M65839 Other synovitis and tenosynovitis, unspecified forearm: Secondary | ICD-10-CM | POA: Insufficient documentation

## 2013-11-03 DIAGNOSIS — M6740 Ganglion, unspecified site: Secondary | ICD-10-CM | POA: Insufficient documentation

## 2013-11-03 DIAGNOSIS — I10 Essential (primary) hypertension: Secondary | ICD-10-CM | POA: Insufficient documentation

## 2013-11-03 DIAGNOSIS — E785 Hyperlipidemia, unspecified: Secondary | ICD-10-CM | POA: Insufficient documentation

## 2013-11-03 HISTORY — PX: TRIGGER FINGER RELEASE: SHX641

## 2013-11-03 LAB — POCT HEMOGLOBIN-HEMACUE: Hemoglobin: 14.2 g/dL (ref 12.0–15.0)

## 2013-11-03 SURGERY — RELEASE, A1 PULLEY, FOR TRIGGER FINGER
Anesthesia: Regional | Site: Hand | Laterality: Left

## 2013-11-03 MED ORDER — CHLORHEXIDINE GLUCONATE 4 % EX LIQD: 60.0000 mL | Freq: Once | CUTANEOUS | Status: DC

## 2013-11-03 MED ORDER — MIDAZOLAM HCL 2 MG/2ML IJ SOLN
INTRAMUSCULAR | Status: AC
  Filled 2013-11-03: qty 2

## 2013-11-03 MED ORDER — FENTANYL CITRATE 0.05 MG/ML IJ SOLN: 50.0000 ug | INTRAMUSCULAR | Status: DC | PRN

## 2013-11-03 MED ORDER — MIDAZOLAM HCL 5 MG/5ML IJ SOLN
INTRAMUSCULAR | Status: DC | PRN
  Administered 2013-11-03: 2 mg via INTRAVENOUS

## 2013-11-03 MED ORDER — BUPIVACAINE HCL (PF) 0.25 % IJ SOLN
INTRAMUSCULAR | Status: DC | PRN
  Administered 2013-11-03: 3 mL

## 2013-11-03 MED ORDER — ONDANSETRON HCL 4 MG/2ML IJ SOLN
INTRAMUSCULAR | Status: DC | PRN
  Administered 2013-11-03: 4 mg via INTRAVENOUS

## 2013-11-03 MED ORDER — FENTANYL CITRATE 0.05 MG/ML IJ SOLN
INTRAMUSCULAR | Status: AC
  Filled 2013-11-03: qty 4

## 2013-11-03 MED ORDER — OXYCODONE HCL 5 MG PO TABS: 5.0000 mg | ORAL_TABLET | Freq: Once | ORAL | Status: DC | PRN

## 2013-11-03 MED ORDER — MIDAZOLAM HCL 2 MG/2ML IJ SOLN: 1.0000 mg | INTRAMUSCULAR | Status: DC | PRN

## 2013-11-03 MED ORDER — BUPIVACAINE HCL (PF) 0.25 % IJ SOLN
INTRAMUSCULAR | Status: AC
  Filled 2013-11-03: qty 30

## 2013-11-03 MED ORDER — LIDOCAINE HCL (PF) 0.5 % IJ SOLN
INTRAMUSCULAR | Status: DC | PRN
  Administered 2013-11-03: 30 mL via INTRAVENOUS

## 2013-11-03 MED ORDER — LACTATED RINGERS IV SOLN
INTRAVENOUS | Status: DC
  Administered 2013-11-03: 12:00:00 via INTRAVENOUS

## 2013-11-03 MED ORDER — KETOROLAC TROMETHAMINE 30 MG/ML IJ SOLN
INTRAMUSCULAR | Status: DC | PRN
  Administered 2013-11-03: 30 mg via INTRAVENOUS

## 2013-11-03 MED ORDER — FENTANYL CITRATE 0.05 MG/ML IJ SOLN: 25.0000 ug | INTRAMUSCULAR | Status: DC | PRN

## 2013-11-03 MED ORDER — LIDOCAINE HCL (CARDIAC) 20 MG/ML IV SOLN
INTRAVENOUS | Status: DC | PRN
  Administered 2013-11-03: 50 mg via INTRAVENOUS

## 2013-11-03 MED ORDER — HYDROCODONE-ACETAMINOPHEN 5-325 MG PO TABS: 1.0000 | ORAL_TABLET | Freq: Four times a day (QID) | ORAL | Status: DC | PRN

## 2013-11-03 MED ORDER — PROPOFOL INFUSION 10 MG/ML OPTIME
INTRAVENOUS | Status: DC | PRN
  Administered 2013-11-03: 200 ug/kg/min via INTRAVENOUS

## 2013-11-03 MED ORDER — CEFAZOLIN SODIUM-DEXTROSE 2-3 GM-% IV SOLR
2.0000 g | INTRAVENOUS | Status: AC
  Administered 2013-11-03: 2 g via INTRAVENOUS

## 2013-11-03 MED ORDER — FENTANYL CITRATE 0.05 MG/ML IJ SOLN
INTRAMUSCULAR | Status: DC | PRN
  Administered 2013-11-03: 100 ug via INTRAVENOUS

## 2013-11-03 MED ORDER — OXYCODONE HCL 5 MG/5ML PO SOLN: 5.0000 mg | Freq: Once | ORAL | Status: DC | PRN

## 2013-11-03 MED ORDER — PROMETHAZINE HCL 25 MG/ML IJ SOLN: 6.2500 mg | INTRAMUSCULAR | Status: DC | PRN

## 2013-11-03 MED ORDER — 0.9 % SODIUM CHLORIDE (POUR BTL) OPTIME
TOPICAL | Status: DC | PRN
  Administered 2013-11-03: 200 mL

## 2013-11-03 SURGICAL SUPPLY — 35 items
BANDAGE COBAN STERILE 2 (GAUZE/BANDAGES/DRESSINGS) ×2 IMPLANT
BLADE SURG 15 STRL LF DISP TIS (BLADE) ×1 IMPLANT
BLADE SURG 15 STRL SS (BLADE) ×2
BNDG CMPR 9X4 STRL LF SNTH (GAUZE/BANDAGES/DRESSINGS)
BNDG ESMARK 4X9 LF (GAUZE/BANDAGES/DRESSINGS) IMPLANT
CHLORAPREP W/TINT 26ML (MISCELLANEOUS) ×2 IMPLANT
CORDS BIPOLAR (ELECTRODE) IMPLANT
COVER MAYO STAND STRL (DRAPES) ×2 IMPLANT
COVER TABLE BACK 60X90 (DRAPES) ×2 IMPLANT
CUFF TOURNIQUET SINGLE 18IN (TOURNIQUET CUFF) ×1 IMPLANT
DECANTER SPIKE VIAL GLASS SM (MISCELLANEOUS) IMPLANT
DRAPE EXTREMITY T 121X128X90 (DRAPE) ×2 IMPLANT
DRAPE SURG 17X23 STRL (DRAPES) ×2 IMPLANT
GAUZE XEROFORM 1X8 LF (GAUZE/BANDAGES/DRESSINGS) ×2 IMPLANT
GLOVE BIO SURGEON STRL SZ 6.5 (GLOVE) ×1 IMPLANT
GLOVE BIOGEL PI IND STRL 7.0 (GLOVE) IMPLANT
GLOVE BIOGEL PI IND STRL 8.5 (GLOVE) ×1 IMPLANT
GLOVE BIOGEL PI INDICATOR 7.0 (GLOVE) ×1
GLOVE BIOGEL PI INDICATOR 8.5 (GLOVE) ×1
GLOVE EXAM NITRILE LRG STRL (GLOVE) ×1 IMPLANT
GLOVE SURG ORTHO 8.0 STRL STRW (GLOVE) ×2 IMPLANT
GOWN BRE IMP PREV XXLGXLNG (GOWN DISPOSABLE) ×2 IMPLANT
GOWN PREVENTION PLUS XLARGE (GOWN DISPOSABLE) ×2 IMPLANT
NEEDLE 27GAX1X1/2 (NEEDLE) ×2 IMPLANT
NS IRRIG 1000ML POUR BTL (IV SOLUTION) ×2 IMPLANT
PACK BASIN DAY SURGERY FS (CUSTOM PROCEDURE TRAY) ×2 IMPLANT
PADDING CAST ABS 4INX4YD NS (CAST SUPPLIES)
PADDING CAST ABS COTTON 4X4 ST (CAST SUPPLIES) ×1 IMPLANT
SPONGE GAUZE 4X4 12PLY (GAUZE/BANDAGES/DRESSINGS) ×2 IMPLANT
STOCKINETTE 4X48 STRL (DRAPES) ×2 IMPLANT
SUT VICRYL RAPIDE 4/0 PS 2 (SUTURE) ×2 IMPLANT
SYR BULB 3OZ (MISCELLANEOUS) ×2 IMPLANT
SYR CONTROL 10ML LL (SYRINGE) ×2 IMPLANT
TOWEL OR 17X24 6PK STRL BLUE (TOWEL DISPOSABLE) ×4 IMPLANT
UNDERPAD 30X30 INCONTINENT (UNDERPADS AND DIAPERS) ×2 IMPLANT

## 2013-11-03 NOTE — Transfer of Care (Signed)
Immediate Anesthesia Transfer of Care Note  Patient: [name]  Procedure(s) Performed: Procedure(s): RELEASE A-1 PULLEY LEFT THUMB AND EXCISION CYST LEFT RING FINGER (Left)  Patient Location: PACU  Anesthesia Type:MAC and Bier block  Level of Consciousness: awake and oriented  Airway & Oxygen Therapy: Patient Spontanous Breathing and Patient connected to face mask oxygen  Post-op Assessment: Report given to PACU RN and Post -op Vital signs reviewed and stable  Post vital signs: Reviewed and stable  Complications: No apparent anesthesia complications

## 2013-11-03 NOTE — Brief Op Note (Signed)
11/03/2013  2:37 PM  PATIENT:  [name]  57 y.o. female  PRE-OPERATIVE DIAGNOSIS:  STENOSING TENOSYNOVITIS BILATERAL THUMBS, CYST LEFT RING FINGER  POST-OPERATIVE DIAGNOSIS:  STENOSING TENOSYNOVITIS BILATERAL THUMBS, CYST LEFT RING FINGER  PROCEDURE:  Procedure(s): RELEASE A-1 PULLEY LEFT THUMB AND EXCISION CYST LEFT RING FINGER (Left)  SURGEON:  Surgeon(s) and Role:    * Nicki Reaper, MD - Primary  PHYSICIAN ASSISTANT:   ASSISTANTS: none   ANESTHESIA:   local and regional  EBL:  Total I/O In: 1000 [I.V.:1000] Out: -   BLOOD ADMINISTERED:none  DRAINS: none   LOCAL MEDICATIONS USED:  BUPIVICAINE   SPECIMEN:  Excision  DISPOSITION OF SPECIMEN:  PATHOLOGY  COUNTS:  YES  TOURNIQUET:   Total Tourniquet Time Documented: Forearm (Left) - 21 minutes Total: Forearm (Left) - 21 minutes   DICTATION: .Other Dictation: Dictation Number [id number]  PLAN OF CARE: Discharge to home after PACU  PATIENT DISPOSITION:  PACU - hemodynamically stable.

## 2013-11-03 NOTE — Op Note (Signed)
Dictation [id number]

## 2013-11-03 NOTE — H&P (Signed)
  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 This has been going on for approximately 1  months, left greater than right. She has no history of injury. No history of diabetes, thyroid problems, or gout. She does have a history of arthritis. She has a small cyst on the A-1 pulley of her left ring finger in addition but no triggering. She has no history of injury. She has been wearing Band-aid splints at night. She complains of a constant moderate dull throbbing and aching pain with use with a catching. She has no history of numbness or tingling.This has been injected on two occasions.  She has developed a cyst on her left ring finger.  PAST MEDICAL HISTORY: She is allergic to Lisinopril, Parafon forte and Morphine. She is on Cardizem, Metoprolol, and Pravastatin. She has had a hysterectomy and tubal ligation and left knee arthroscopy.  FAMILY H ISTORY: Positive for diabetes, heart disease, high BP and arthritis.  SOCIAL HISTORY: She does not smoke. She drinks socially. She is married and an OR tech.  REVIEW OF SYSTEMS: Positive for glasses and high BP, otherwise negative for 14 points. Itzell LYBERTI THRUSH is an 57 y.o. female.   Chief Complaint: sts left thumb and cyst left ring finger HPI: see above  Past Medical History  Diagnosis Date  . Hypertension   . Borderline hyperlipidemia     Past Surgical History  Procedure Laterality Date  . Tubal ligation  1995  . Wisdom tooth extraction  2010  . Knee arthroscopy  2012    Left knee  . Vaginal hysterectomy  2009  . 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 Diabetes Mother   . Hypertension Mother   . Heart disease Father   . Hypertension Father   . Hyperlipidemia Father   . Diabetes Sister   . Diabetes Sister   . Diabetes Sister    Social History:  reports that she has never  smoked. She does not have any smokeless tobacco history on file. She reports that she does not drink alcohol. Her drug history is not on file.  Allergies:  Allergies  Allergen Reactions  . Lisinopril     REACTION: hives, itching  . Morphine     REACTION: hives    No prescriptions prior to admission    No results found for this or any previous visit (from the past 48 hour(s)).  No results found.   Pertinent items are noted in HPI.  Height 5\' 2"  (1.575 m), weight 138 lb (62.596 kg).  General appearance: alert, cooperative and appears stated age Head: Normocephalic, without obvious abnormality Neck: no JVD Resp: clear to auscultation bilaterally Cardio: regular rate and rhythm, S1, S2 normal, no murmur, click, rub or gallop GI: soft, non-tender; bowel sounds normal; no masses,  no organomegaly Extremities: extremities normal, atraumatic, no cyanosis or edema Pulses: 2+ and symmetric Skin: Skin color, texture, turgor normal. No rashes or lesions Neurologic: Grossly normal Incision/Wound: na  Assessment/Plan She is scheduled for release A-1 pulley left thumb and excision cyst left ring finger as an outpatient under regional anesthesia.  Asaiah Scarber R 11/03/2013, 10:23 AM

## 2013-11-03 NOTE — Anesthesia Preprocedure Evaluation (Signed)
Anesthesia Evaluation  Patient identified by MRN, date of birth, ID band Patient awake    History of Anesthesia Complications Negative for: history of anesthetic complications  Airway Mallampati: I      Dental  (+) Teeth Intact   Pulmonary neg pulmonary ROS,  breath sounds clear to auscultation        Cardiovascular hypertension, Rhythm:Regular Rate:Normal     Neuro/Psych negative neurological ROS     GI/Hepatic negative GI ROS, Neg liver ROS,   Endo/Other    Renal/GU negative Renal ROS     Musculoskeletal negative musculoskeletal ROS (+)   Abdominal   Peds  Hematology negative hematology ROS (+)   Anesthesia Other Findings   Reproductive/Obstetrics                           Anesthesia Physical Anesthesia Plan  ASA: II  Anesthesia Plan: Regional   Post-op Pain Management:    Induction: Intravenous  Airway Management Planned: Natural Airway and Simple Face Mask  Additional Equipment:   Intra-op Plan:   Post-operative Plan:   Informed Consent: I have reviewed the patients History and Physical, chart, labs and discussed the procedure including the risks, benefits and alternatives for the proposed anesthesia with the patient or authorized representative who has indicated his/her understanding and acceptance.   Dental advisory given  Plan Discussed with: CRNA and Surgeon  Anesthesia Plan Comments:         Anesthesia Quick Evaluation

## 2013-11-03 NOTE — Anesthesia Postprocedure Evaluation (Signed)
  Anesthesia Post-op Note  Patient: [name]  Procedure(s) Performed: Procedure(s): RELEASE A-1 PULLEY LEFT THUMB AND EXCISION CYST LEFT RING FINGER (Left)  Patient Location: PACU  Anesthesia Type:Regional  Level of Consciousness: awake and alert   Airway and Oxygen Therapy: Patient Spontanous Breathing  Post-op Pain: mild  Post-op Assessment: Post-op Vital signs reviewed  Post-op Vital Signs: stable  Complications: No apparent anesthesia complications

## 2013-11-03 NOTE — Anesthesia Procedure Notes (Signed)
Procedure Name: MAC Performed by: Domique Reardon W Pre-anesthesia Checklist: Patient identified, Timeout performed, Emergency Drugs available, Suction available and Patient being monitored Patient Re-evaluated:Patient Re-evaluated prior to inductionOxygen Delivery Method: Simple face mask Placement Confirmation: positive ETCO2 Dental Injury: Teeth and Oropharynx as per pre-operative assessment      

## 2013-11-04 ENCOUNTER — Encounter (HOSPITAL_BASED_OUTPATIENT_CLINIC_OR_DEPARTMENT_OTHER): Payer: Self-pay | Admitting: Orthopedic Surgery

## 2013-11-04 NOTE — Op Note (Signed)
[name], VIRGO.:  [id number]  MEDICAL RECORD NO.:  [id number]  LOCATION:                                 FACILITY:  PHYSICIAN:  Cindee Salt, M.D.            DATE OF BIRTH:  DATE OF PROCEDURE:  11/03/2013 DATE OF DISCHARGE:                              OPERATIVE REPORT   PREOPERATIVE DIAGNOSIS:  Stenosing tenosynovitis, left thumb with flexor sheath cyst, left ring.  POSTOPERATIVE DIAGNOSIS:  Stenosing tenosynovitis, left thumb with flexor sheath cyst, left ring.  OPERATION:  Release A1 pulley, left thumb with excision of flexor sheath cyst, left ring finger.  SURGEON:  Cindee Salt, M.D.  ANESTHESIA:  Forearm-based IV regional with local infiltration.  ANESTHESIOLOGIST:  [name], M.D.  HISTORY:  The patient is a 57 year old female with a history of bilateral locked trigger thumbs, not responsive to injections.  She has also developed a cyst over her ring finger.  She is desirous of release of the A1 pulley, excision of the cyst.  Pre, peri, and postoperative course have been discussed along with risks and complications.  She is aware that there is no guarantee with surgery, possibility of infection, recurrence of injury to arteries, nerves, tendons, incomplete relief of symptoms, and dystrophy.  In the preoperative area, the patient is seen, the extremity marked by both patient and surgeon.  Antibiotic given.  PROCEDURE IN DETAIL:  The patient was brought to the operating room, where forearm-based IV regional anesthetic was carried out without difficulty.  She was prepped using ChloraPrep, supine position with the left arm free.  A 3-minute dry time was allowed.  Time-out taken, confirming the patient and procedure.  An oblique incision was made over the A1 pulley of the left ring finger, carried down through subcutaneous tissue.  The cyst was immediately encountered.  Retractor was placed protecting neurovascular bundles radially and  ulnarly.  The cyst was excised and sent to Pathology.  A small opening was made in the pulley to facilitate scarring.  The wound was copiously irrigated with saline. The skin then closed with a subcuticular 4-0 Vicryl Rapide.  A separate incision was made transversely over the metacarpophalangeal joint of the left thumb, carried down through subcutaneous tissue.  The neurovascular bundle was retracted, the A1 pulley was identified, and an incision was made on its radial aspect taking care to protect the oblique pulley. The tenosynovial tissue proximally was then separated with blunt dissection.  The thumb placed through a full range motion, no further triggering was noted.  Neurovascular bundles were protected throughout the procedure.  The wound was irrigated and closed with a subcuticular 4- 0 Vicryl Rapide sutures.  Dermabond was then placed on each of the wounds after cleansing the skin with alcohol.  A sterile compressive dressing was applied.  After placement of the Dermabond, each site was injected with 0.25% Marcaine without epinephrine, approximately 5 mL was used.  A sterile compressive dressing applied.  On deflation of the tourniquet, all fingers immediately pinked.  She was taken to the recovery room for observation in a satisfactory condition.  She  will be discharged home to return in 1 week on Vicodin.          ______________________________ Cindee Salt, M.D.     GK/MEDQ  D:  11/03/2013  T:  11/04/2013  Job:  [id number]

## 2013-11-06 HISTORY — PX: METATARSAL OSTEOTOMY: SHX1641

## 2014-02-16 ENCOUNTER — Encounter (HOSPITAL_BASED_OUTPATIENT_CLINIC_OR_DEPARTMENT_OTHER): Payer: Self-pay | Admitting: *Deleted

## 2014-02-16 NOTE — Progress Notes (Signed)
To get bmet-ekg-12/14

## 2014-02-18 ENCOUNTER — Other Ambulatory Visit: Payer: Self-pay | Admitting: Orthopedic Surgery

## 2014-02-18 NOTE — H&P (Signed)
[name] is an 58 y.o. female.   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 Pt denies N/V/F/C, chest pain, SOB, calf pain or paresthesia b/l.   Past Medical History  Diagnosis Date  . Hypertension   . Borderline hyperlipidemia     Past Surgical History  Procedure Laterality Date  . Tubal ligation  1995  . Wisdom tooth extraction  2010  . Knee arthroscopy  2012    Left knee  . Vaginal hysterectomy  2009  . 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 Trigger finger release Left 11/03/2013    Procedure: RELEASE A-1 PULLEY LEFT THUMB AND EXCISION CYST LEFT RING FINGER;  Surgeon: Wynonia Sours, MD;  Location: [place];  Service: Orthopedics;  Laterality: Left;    Family History  Problem Relation Age of Onset  . Diabetes Mother   . Hypertension Mother   . Heart disease Father   . Hypertension Father   . Hyperlipidemia Father   . Diabetes Sister   . Diabetes Sister   . Diabetes Sister    Social History:  reports that she has never smoked. She does not have any smokeless tobacco history on file. She reports that she does not drink alcohol. Her drug history is not on file.  Allergies:  Allergies  Allergen Reactions  . Lisinopril     REACTION: hives, itching  . Morphine     REACTION: hives  . Parafon Forte Dsc [Chlorzoxazone] Swelling    Face and neck swelling     No prescriptions prior to admission    No results found for this or any previous visit (from the past 48 hour(s)). No results found.  Review of Systems  Constitutional: Negative.   HENT: Negative.   Eyes: Negative.   Respiratory: Negative.   Cardiovascular: Negative.   Gastrointestinal: Negative.   Musculoskeletal: Negative.   Skin: Negative.   Neurological: Negative for tremors.  Endo/Heme/Allergies: Negative.   Psychiatric/Behavioral: The patient is  not nervous/anxious.     Height 5\' 2"  (1.575 m), weight 62.596 kg (138 lb). Physical Exam  58 y/o WD WN, in NAD, A/Ox3, appears stated age. EOMI, mood and affect normal, respirations unlabored. The pt's gait is antalgic to the left. 5/5 strength with PF and DF of the ankle. DP pulses 2+ b/l. +TTP to dorsal left forefoot and over the 2nd and 3rd MTP joints. Normal sensation to light touch intact. No lymphadenopathy. No signs of infection noted.  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   [name] 02/18/2014, 11:36 AM   Agree with note above.  To OR for left forefoot surgery.  The risks and benefits of the alternative treatment options have been discussed in detail.  The patient wishes to proceed with surgery and specifically understands risks of bleeding, infection, nerve damage, blood clots, need for additional surgery, amputation and death.

## 2014-02-19 ENCOUNTER — Encounter (HOSPITAL_BASED_OUTPATIENT_CLINIC_OR_DEPARTMENT_OTHER)
Admission: RE | Disposition: A | Discharge status: Home / Self Care | Payer: Self-pay | Source: Ambulatory Visit | Attending: Orthopedic Surgery

## 2014-02-19 ENCOUNTER — Ambulatory Visit (HOSPITAL_BASED_OUTPATIENT_CLINIC_OR_DEPARTMENT_OTHER)
Admission: RE | Admit: 2014-02-19 | Discharge: 2014-02-19 | Disposition: A | Discharge status: Home / Self Care | Payer: 59 | Source: Ambulatory Visit | Attending: Orthopedic Surgery | Admitting: Orthopedic Surgery

## 2014-02-19 ENCOUNTER — Ambulatory Visit (HOSPITAL_BASED_OUTPATIENT_CLINIC_OR_DEPARTMENT_OTHER): Payer: 59 | Admitting: Certified Registered"

## 2014-02-19 ENCOUNTER — Encounter (HOSPITAL_BASED_OUTPATIENT_CLINIC_OR_DEPARTMENT_OTHER): Payer: Self-pay | Admitting: Certified Registered"

## 2014-02-19 ENCOUNTER — Encounter (HOSPITAL_BASED_OUTPATIENT_CLINIC_OR_DEPARTMENT_OTHER): Payer: 59 | Admitting: Certified Registered"

## 2014-02-19 DIAGNOSIS — M21619 Bunion of unspecified foot: Secondary | ICD-10-CM | POA: Insufficient documentation

## 2014-02-19 DIAGNOSIS — M24873 Other specific joint derangements of unspecified ankle, not elsewhere classified: Secondary | ICD-10-CM | POA: Insufficient documentation

## 2014-02-19 DIAGNOSIS — E785 Hyperlipidemia, unspecified: Secondary | ICD-10-CM | POA: Insufficient documentation

## 2014-02-19 DIAGNOSIS — I10 Essential (primary) hypertension: Secondary | ICD-10-CM | POA: Insufficient documentation

## 2014-02-19 DIAGNOSIS — G5760 Lesion of plantar nerve, unspecified lower limb: Secondary | ICD-10-CM | POA: Insufficient documentation

## 2014-02-19 DIAGNOSIS — Z885 Allergy status to narcotic agent status: Secondary | ICD-10-CM | POA: Insufficient documentation

## 2014-02-19 DIAGNOSIS — X58XXXA Exposure to other specified factors, initial encounter: Secondary | ICD-10-CM | POA: Insufficient documentation

## 2014-02-19 DIAGNOSIS — S93529A Sprain of metatarsophalangeal joint of unspecified toe(s), initial encounter: Secondary | ICD-10-CM | POA: Insufficient documentation

## 2014-02-19 DIAGNOSIS — M24876 Other specific joint derangements of unspecified foot, not elsewhere classified: Secondary | ICD-10-CM

## 2014-02-19 DIAGNOSIS — M79609 Pain in unspecified limb: Secondary | ICD-10-CM | POA: Insufficient documentation

## 2014-02-19 DIAGNOSIS — M2040 Other hammer toe(s) (acquired), unspecified foot: Secondary | ICD-10-CM | POA: Insufficient documentation

## 2014-02-19 DIAGNOSIS — Z888 Allergy status to other drugs, medicaments and biological substances status: Secondary | ICD-10-CM | POA: Insufficient documentation

## 2014-02-19 HISTORY — PX: HAMMER TOE SURGERY: SHX385

## 2014-02-19 LAB — POCT HEMOGLOBIN-HEMACUE: Hemoglobin: 15.5 g/dL — ABNORMAL HIGH (ref 12.0–15.0)

## 2014-02-19 SURGERY — CORRECTION, HAMMER TOE
Anesthesia: Regional | Site: Foot | Laterality: Left

## 2014-02-19 MED ORDER — MIDAZOLAM HCL 2 MG/2ML IJ SOLN
INTRAMUSCULAR | Status: AC
  Filled 2014-02-19: qty 2

## 2014-02-19 MED ORDER — LIDOCAINE HCL (CARDIAC) 20 MG/ML IV SOLN
INTRAVENOUS | Status: DC | PRN
  Administered 2014-02-19: 30 mg via INTRAVENOUS

## 2014-02-19 MED ORDER — FENTANYL CITRATE 0.05 MG/ML IJ SOLN
INTRAMUSCULAR | Status: AC
Start: 2014-02-19 — End: 2014-02-19
  Filled 2014-02-19: qty 2

## 2014-02-19 MED ORDER — SODIUM CHLORIDE 0.9 % IV SOLN: INTRAVENOUS | Status: DC

## 2014-02-19 MED ORDER — CEFAZOLIN SODIUM-DEXTROSE 2-3 GM-% IV SOLR
2.0000 g | INTRAVENOUS | Status: AC
Start: 2014-02-19 — End: 2014-02-19
  Administered 2014-02-19: 2 g via INTRAVENOUS

## 2014-02-19 MED ORDER — LACTATED RINGERS IV SOLN
INTRAVENOUS | Status: DC
  Administered 2014-02-19 (×2): via INTRAVENOUS

## 2014-02-19 MED ORDER — DEXAMETHASONE SODIUM PHOSPHATE 10 MG/ML IJ SOLN
INTRAMUSCULAR | Status: DC | PRN
  Administered 2014-02-19: 10 mg via INTRAVENOUS

## 2014-02-19 MED ORDER — FENTANYL CITRATE 0.05 MG/ML IJ SOLN
INTRAMUSCULAR | Status: AC
  Filled 2014-02-19: qty 6

## 2014-02-19 MED ORDER — MIDAZOLAM HCL 5 MG/5ML IJ SOLN
INTRAMUSCULAR | Status: DC | PRN
  Administered 2014-02-19: 1 mg via INTRAVENOUS

## 2014-02-19 MED ORDER — CHLORHEXIDINE GLUCONATE 4 % EX LIQD: 60.0000 mL | Freq: Once | CUTANEOUS | Status: DC

## 2014-02-19 MED ORDER — MIDAZOLAM HCL 2 MG/2ML IJ SOLN
1.0000 mg | INTRAMUSCULAR | Status: DC | PRN
Start: 2014-02-19 — End: 2014-02-19
  Administered 2014-02-19: 2 mg via INTRAVENOUS

## 2014-02-19 MED ORDER — OXYCODONE HCL 5 MG/5ML PO SOLN: 5.0000 mg | Freq: Once | ORAL | Status: DC | PRN

## 2014-02-19 MED ORDER — BUPIVACAINE HCL (PF) 0.5 % IJ SOLN
INTRAMUSCULAR | Status: DC | PRN
  Administered 2014-02-19: 10 mL

## 2014-02-19 MED ORDER — FENTANYL CITRATE 0.05 MG/ML IJ SOLN
50.0000 ug | INTRAMUSCULAR | Status: DC | PRN
  Administered 2014-02-19: 100 ug via INTRAVENOUS

## 2014-02-19 MED ORDER — OXYCODONE HCL 5 MG PO TABS: 5.0000 mg | ORAL_TABLET | Freq: Once | ORAL | Status: DC | PRN

## 2014-02-19 MED ORDER — ONDANSETRON HCL 4 MG/2ML IJ SOLN
INTRAMUSCULAR | Status: DC | PRN
  Administered 2014-02-19: 4 mg via INTRAVENOUS

## 2014-02-19 MED ORDER — BUPIVACAINE-EPINEPHRINE PF 0.5-1:200000 % IJ SOLN
INTRAMUSCULAR | Status: DC | PRN
  Administered 2014-02-19: 30 mL via PERINEURAL

## 2014-02-19 MED ORDER — PROPOFOL 10 MG/ML IV BOLUS
INTRAVENOUS | Status: DC | PRN
  Administered 2014-02-19: 100 mg via INTRAVENOUS

## 2014-02-19 MED ORDER — HYDROMORPHONE HCL PF 1 MG/ML IJ SOLN: 0.2500 mg | INTRAMUSCULAR | Status: DC | PRN

## 2014-02-19 MED ORDER — CEFAZOLIN SODIUM-DEXTROSE 2-3 GM-% IV SOLR
INTRAVENOUS | Status: AC
  Filled 2014-02-19: qty 50

## 2014-02-19 MED ORDER — HYDROCODONE-ACETAMINOPHEN 5-325 MG PO TABS: 1.0000 | ORAL_TABLET | Freq: Four times a day (QID) | ORAL | Status: DC | PRN

## 2014-02-19 SURGICAL SUPPLY — 67 items
BANDAGE ESMARK 6X9 LF (GAUZE/BANDAGES/DRESSINGS) IMPLANT
BLADE AVERAGE 25X9 (BLADE) ×2 IMPLANT
BLADE OSC/SAG .038X5.5 CUT EDG (BLADE) ×2 IMPLANT
BLADE SURG 15 STRL LF DISP TIS (BLADE) ×2 IMPLANT
BLADE SURG 15 STRL SS (BLADE) ×4
BNDG CMPR 9X4 STRL LF SNTH (GAUZE/BANDAGES/DRESSINGS)
BNDG CMPR 9X6 STRL LF SNTH (GAUZE/BANDAGES/DRESSINGS) ×1
BNDG COHESIVE 4X5 TAN STRL (GAUZE/BANDAGES/DRESSINGS) ×2 IMPLANT
BNDG CONFORM 2 STRL LF (GAUZE/BANDAGES/DRESSINGS) ×1 IMPLANT
BNDG CONFORM 3 STRL LF (GAUZE/BANDAGES/DRESSINGS) ×1 IMPLANT
BNDG ESMARK 4X9 LF (GAUZE/BANDAGES/DRESSINGS) IMPLANT
BNDG ESMARK 6X9 LF (GAUZE/BANDAGES/DRESSINGS) ×2
CAP PIN PROTECTOR ORTHO WHT (CAP) IMPLANT
CHLORAPREP W/TINT 26ML (MISCELLANEOUS) ×2 IMPLANT
COVER TABLE BACK 60X90 (DRAPES) ×2 IMPLANT
CUFF TOURNIQUET SINGLE 34IN LL (TOURNIQUET CUFF) ×1 IMPLANT
DRAPE EXTREMITY T 121X128X90 (DRAPE) ×2 IMPLANT
DRAPE OEC MINIVIEW 54X84 (DRAPES) ×2 IMPLANT
DRAPE U-SHAPE 47X51 STRL (DRAPES) ×2 IMPLANT
DRSG EMULSION OIL 3X3 NADH (GAUZE/BANDAGES/DRESSINGS) ×2 IMPLANT
DRSG PAD ABDOMINAL 8X10 ST (GAUZE/BANDAGES/DRESSINGS) IMPLANT
ELECT REM PT RETURN 9FT ADLT (ELECTROSURGICAL) ×2
ELECTRODE REM PT RTRN 9FT ADLT (ELECTROSURGICAL) ×1 IMPLANT
GLOVE BIO SURGEON STRL SZ8 (GLOVE) ×2 IMPLANT
GLOVE BIOGEL PI IND STRL 7.0 (GLOVE) IMPLANT
GLOVE BIOGEL PI IND STRL 7.5 (GLOVE) ×1 IMPLANT
GLOVE BIOGEL PI IND STRL 8 (GLOVE) ×1 IMPLANT
GLOVE BIOGEL PI INDICATOR 7.0 (GLOVE) ×1
GLOVE BIOGEL PI INDICATOR 7.5 (GLOVE)
GLOVE BIOGEL PI INDICATOR 8 (GLOVE) ×1
GLOVE ECLIPSE 6.5 STRL STRAW (GLOVE) ×1 IMPLANT
GLOVE ECLIPSE 7.0 STRL STRAW (GLOVE) ×1 IMPLANT
GLOVE EXAM NITRILE MD LF STRL (GLOVE) ×1 IMPLANT
GOWN STRL REUS W/ TWL LRG LVL3 (GOWN DISPOSABLE) ×2 IMPLANT
GOWN STRL REUS W/ TWL XL LVL3 (GOWN DISPOSABLE) ×1 IMPLANT
GOWN STRL REUS W/TWL LRG LVL3 (GOWN DISPOSABLE) ×2
GOWN STRL REUS W/TWL XL LVL3 (GOWN DISPOSABLE) ×2
K-WIRE .054X4 (WIRE) IMPLANT
NEEDLE HYPO 22GX1.5 SAFETY (NEEDLE) IMPLANT
NS IRRIG 1000ML POUR BTL (IV SOLUTION) ×2 IMPLANT
PACK BASIN DAY SURGERY FS (CUSTOM PROCEDURE TRAY) ×2 IMPLANT
PAD CAST 4YDX4 CTTN HI CHSV (CAST SUPPLIES) ×1 IMPLANT
PADDING CAST ABS 4INX4YD NS (CAST SUPPLIES)
PADDING CAST ABS COTTON 4X4 ST (CAST SUPPLIES) ×1 IMPLANT
PADDING CAST COTTON 4X4 STRL (CAST SUPPLIES) ×2
PASSER SUT SWANSON 36MM LOOP (INSTRUMENTS) IMPLANT
PENCIL BUTTON HOLSTER BLD 10FT (ELECTRODE) ×1 IMPLANT
SANITIZER HAND PURELL 535ML FO (MISCELLANEOUS) ×2 IMPLANT
SHEET MEDIUM DRAPE 40X70 STRL (DRAPES) ×2 IMPLANT
SLEEVE SCD COMPRESS KNEE MED (MISCELLANEOUS) ×2 IMPLANT
SPONGE GAUZE 4X4 12PLY (GAUZE/BANDAGES/DRESSINGS) ×2 IMPLANT
SPONGE LAP 18X18 X RAY DECT (DISPOSABLE) ×2 IMPLANT
STOCKINETTE 6  STRL (DRAPES) ×1
STOCKINETTE 6 STRL (DRAPES) ×1 IMPLANT
SUCTION FRAZIER TIP 10 FR DISP (SUCTIONS) ×2 IMPLANT
SUT ETHILON 3 0 PS 1 (SUTURE) ×3 IMPLANT
SUT MNCRL AB 3-0 PS2 18 (SUTURE) ×2 IMPLANT
SUT VIC AB 2-0 SH 27 (SUTURE) ×2
SUT VIC AB 2-0 SH 27XBRD (SUTURE) IMPLANT
SYR BULB 3OZ (MISCELLANEOUS) ×2 IMPLANT
SYR CONTROL 10ML LL (SYRINGE) IMPLANT
TOWEL OR 17X24 6PK STRL BLUE (TOWEL DISPOSABLE) ×2 IMPLANT
TOWEL OR NON WOVEN STRL DISP B (DISPOSABLE) IMPLANT
TUBE CONNECTING 20X1/4 (TUBING) ×2 IMPLANT
TWIST OFF SCREW 12MM (Screw) ×2 IMPLANT
UNDERPAD 30X30 INCONTINENT (UNDERPADS AND DIAPERS) ×2 IMPLANT
YANKAUER SUCT BULB TIP NO VENT (SUCTIONS) IMPLANT

## 2014-02-19 NOTE — Progress Notes (Signed)
Assisted Dr. Ola Spurr with left, ultrasound guided, popliteal block. Side rails up, monitors on throughout procedure. See vital signs in flow sheet. Tolerated Procedure well.

## 2014-02-19 NOTE — Anesthesia Preprocedure Evaluation (Signed)
Anesthesia Evaluation  Patient identified by MRN, date of birth, ID band Patient awake    Reviewed: Allergy & Precautions, H&P , NPO status , Patient's Chart, lab work & pertinent test results, reviewed documented beta blocker date and time   Airway Mallampati: II TM Distance: >3 FB Neck ROM: Full    Dental no notable dental hx. (+) Teeth Intact, Dental Advisory Given   Pulmonary neg pulmonary ROS,  breath sounds clear to auscultation  Pulmonary exam normal       Cardiovascular hypertension, On Medications and On Home Beta Blockers Rhythm:Regular Rate:Normal     Neuro/Psych negative neurological ROS  negative psych ROS   GI/Hepatic negative GI ROS, Neg liver ROS,   Endo/Other  negative endocrine ROS  Renal/GU negative Renal ROS  negative genitourinary   Musculoskeletal   Abdominal   Peds  Hematology negative hematology ROS (+)   Anesthesia Other Findings   Reproductive/Obstetrics negative OB ROS                           Anesthesia Physical Anesthesia Plan  ASA: II  Anesthesia Plan: General and Regional   Post-op Pain Management:    Induction: Intravenous  Airway Management Planned: LMA  Additional Equipment:   Intra-op Plan:   Post-operative Plan: Extubation in OR  Informed Consent: I have reviewed the patients History and Physical, chart, labs and discussed the procedure including the risks, benefits and alternatives for the proposed anesthesia with the patient or authorized representative who has indicated his/her understanding and acceptance.   Dental advisory given  Plan Discussed with: CRNA  Anesthesia Plan Comments:         Anesthesia Quick Evaluation

## 2014-02-19 NOTE — Transfer of Care (Signed)
Immediate Anesthesia Transfer of Care Note  Patient: [name]  Procedure(s) Performed: Procedure(s): LEFT 2-3 WEIL,LEFT 2-3 COLLATERAL LIGAMENT REPAIR,LEFT 3RD WEB SPACE MORTONS NEUROMA EXCISION POSSIBLE HAMMERTOE CORRECTION,LEFT [place] (Left)  Patient Location: PACU  Anesthesia Type:GA combined with regional for post-op pain  Level of Consciousness: awake and patient cooperative  Airway & Oxygen Therapy: Patient Spontanous Breathing and Patient connected to face mask oxygen  Post-op Assessment: Report given to PACU RN and Post -op Vital signs reviewed and stable  Post vital signs: Reviewed and stable  Complications: No apparent anesthesia complications

## 2014-02-19 NOTE — Brief Op Note (Signed)
02/19/2014  1:23 PM  PATIENT:  [name]  58 y.o. female  PRE-OPERATIVE DIAGNOSIS:   1.  Left 2nd and 3rd hammertoes and metatarsalgia      2.  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  Left bunionette deformity   POST-OPERATIVE DIAGNOSIS:  same  Procedure(s): 1.  Left 2nd MT weil osteotomy 2.  Left 3rd MT weil osteotomy 3.  Left 2nd MTP collateral ligament repair 4.  Left 2nd webspace morton's neuroma excision 5.  Left 2nd hammertoe correction 6.  Left 2nd toe open FDL tendon release 7.  Left bunionette excision 8.  AP and lateral radiographs of the left foot  SURGEON:  Wylene Simmer, MD  ASSISTANT: n/a  ANESTHESIA:   General, regional  EBL:  minimal   TOURNIQUET:   Total Tourniquet Time Documented: Thigh (Left) - 53 minutes Total: Thigh (Left) - 53 minutes   COMPLICATIONS:  None apparent  DISPOSITION:  Extubated, awake and stable to recovery.  DICTATION ID:  [id number]

## 2014-02-19 NOTE — Anesthesia Procedure Notes (Addendum)
Anesthesia Regional Block:  Popliteal block  Pre-Anesthetic Checklist: ,, timeout performed, Correct Patient, Correct Site, Correct Laterality, Correct Procedure, Correct Position, site marked, Risks and benefits discussed, pre-op evaluation, post-op pain management  Laterality: Left  Prep: Maximum Sterile Barrier Precautions used and chloraprep       Needles:  Injection technique: Single-shot  Needle Type: Echogenic Stimulator Needle     Needle Length: 9cm 9 cm Needle Gauge: 21 and 21 G    Additional Needles:  Procedures: ultrasound guided (picture in chart) and nerve stimulator Popliteal block  Nerve Stimulator or Paresthesia:  Response: Peroneal,  Response: Tibial,   Additional Responses:   Narrative:  Start time: 02/19/2014 10:55 AM End time: 02/19/2014 11:05 AM Injection made incrementally with aspirations every 5 mL. Anesthesiologist: Ola Spurr, MD  Additional Notes: 2% Lidocaine skin wheel. Saphenous block with 10cc of 0.5% Bupivicaine plain.   Procedure Name: LMA Insertion Date/Time: 02/19/2014 12:04 PM Performed by: Kerline Trahan Pre-anesthesia Checklist: Patient identified, Emergency Drugs available, Suction available and Patient being monitored Patient Re-evaluated:Patient Re-evaluated prior to inductionOxygen Delivery Method: Circle System Utilized Preoxygenation: Pre-oxygenation with 100% oxygen Intubation Type: IV induction Ventilation: Mask ventilation without difficulty LMA: LMA inserted LMA Size: 4.0 Number of attempts: 1 Airway Equipment and Method: bite block Placement Confirmation: positive ETCO2 Tube secured with: Tape Dental Injury: Teeth and Oropharynx as per pre-operative assessment

## 2014-02-19 NOTE — Anesthesia Postprocedure Evaluation (Signed)
  Anesthesia Post-op Note  Patient: [name]  Procedure(s) Performed: Procedure(s): LEFT 2-3 WEIL,LEFT 2-3 COLLATERAL LIGAMENT REPAIR,LEFT 3RD WEB SPACE MORTONS NEUROMA EXCISION POSSIBLE HAMMERTOE CORRECTION,LEFT [place] (Left)  Patient Location: PACU  Anesthesia Type:General and block  Level of Consciousness: awake and alert   Airway and Oxygen Therapy: Patient Spontanous Breathing  Post-op Pain: none  Post-op Assessment: Post-op Vital signs reviewed, Patient's Cardiovascular Status Stable and Respiratory Function Stable  Post-op Vital Signs: Reviewed  Filed Vitals:   02/19/14 1444  BP: 133/77  Pulse:   Temp: 36.4 C  Resp: 16    Complications: No apparent anesthesia complications

## 2014-02-19 NOTE — Discharge Instructions (Signed)
Wylene Simmer, MD [place]  Please read the following information regarding your care after surgery.  Medications  You only need a prescription for the narcotic pain medicine (ex. oxycodone, Percocet, Norco).  All of the other medicines listed below are available over the counter. ? acetominophen (Tylenol) 650 mg every 4-6 hours as you need for minor pain ? oxycodone as prescribed for moderate to severe pain X norco as prescribed for pain   Narcotic pain medicine (ex. oxycodone, Percocet, Vicodin) will cause constipation.  To prevent this problem, take the following medicines while you are taking any pain medicine. X docusate sodium (Colace) 100 mg twice a day X senna (Senokot) 2 tablets twice a day  Weight Bearing ? Bear weight when you are able on your operated leg or foot. X Bear weight only on the heel of your operated foot in the post-op shoe. ? Do not bear any weight on the operated leg or foot.  Cast / Splint / Dressing X Keep your splint or cast clean and dry.  Dont put anything (coat hanger, pencil, etc) down inside of it.  If it gets damp, use a hair dryer on the cool setting to dry it.  If it gets soaked, call the office to schedule an appointment for a cast change. ? Remove your dressing 3 days after surgery and cover the incisions with dry dressings.    After your dressing, cast or splint is removed; you may shower, but do not soak or scrub the wound.  Allow the water to run over it, and then gently pat it dry.  Swelling It is normal for you to have swelling where you had surgery.  To reduce swelling and pain, keep your toes above your nose for at least 3 days after surgery.  It may be necessary to keep your foot or leg elevated for several weeks.  If it hurts, it should be elevated.  Follow Up Call my office at [phone] when you are discharged from the hospital or surgery center to schedule an appointment to be seen two weeks after surgery.  Call my  office at [phone] if you develop a fever >101.5 F, nausea, vomiting, bleeding from the surgical site or severe pain.     Post Anesthesia Home Care Instructions  Activity: Get plenty of rest for the remainder of the day. A responsible adult should stay with you for 24 hours following the procedure.  For the next 24 hours, DO NOT: -Drive a car -Paediatric nurse -Drink alcoholic beverages -Take any medication unless instructed by your physician -Make any legal decisions or sign important papers.  Meals: Start with liquid foods such as gelatin or soup. Progress to regular foods as tolerated. Avoid greasy, spicy, heavy foods. If nausea and/or vomiting occur, drink only clear liquids until the nausea and/or vomiting subsides. Call your physician if vomiting continues.  Special Instructions/Symptoms: Your throat may feel dry or sore from the anesthesia or the breathing tube placed in your throat during surgery. If this causes discomfort, gargle with warm salt water. The discomfort should disappear within 24 hours.   Regional Anesthesia Blocks  1. Numbness or the inability to move the "blocked" extremity may last from 3-48 hours after placement. The length of time depends on the medication injected and your individual response to the medication. If the numbness is not going away after 48 hours, call your surgeon.  2. The extremity that is blocked will need to be protected until the numbness is gone and  the  Strength has returned. Because you cannot feel it, you will need to take extra care to avoid injury. Because it may be weak, you may have difficulty moving it or using it. You may not know what position it is in without looking at it while the block is in effect.  3. For blocks in the legs and feet, returning to weight bearing and walking needs to be done carefully. You will need to wait until the numbness is entirely gone and the strength has returned. You should be able to move your  leg and foot normally before you try and bear weight or walk. You will need someone to be with you when you first try to ensure you do not fall and possibly risk injury.  4. Bruising and tenderness at the needle site are common side effects and will resolve in a few days.  5. Persistent numbness or new problems with movement should be communicated to the surgeon or the [facility] [phone] [place] [phone]).

## 2014-02-20 NOTE — Op Note (Signed)
NAMENECOLE, [name]                 ACCOUNT NO.:  [id number]  MEDICAL RECORD NO.:  [id number]  LOCATION:                                 FACILITY:  PHYSICIAN:  Wylene Simmer, MD             DATE OF BIRTH:  DATE OF PROCEDURE:  02/19/2014 DATE OF DISCHARGE:                              OPERATIVE REPORT   PREOPERATIVE DIAGNOSES: 1. Left second and third hammertoe deformities and metatarsalgia. 2. Left second webspace Morton neuroma. 3. Left bunionette deformity. 4. Left second metatarsophalangeal joint medial deviation with lateral     collateral ligament tear.  POSTOPERATIVE DIAGNOSES: 1. Left second and third hammertoe deformities and metatarsalgia. 2. Left second webspace Morton neuroma. 3. Left bunionette deformity. 4. Left second metatarsophalangeal joint medial deviation with lateral     collateral ligament tear.  Postop diagnosis same.  PROCEDURES: 1. Left second metatarsal Weil osteotomy. 2. Left third metatarsal Weil osteotomy. 3. Left second metatarsophalangeal joint collateral ligament repair. 4. Left second webspace Morton neuroma excision. 5. Left second hammertoe correction. 6. Left second toe open FDL tendon release. 7. Left bunionette excision. 8. AP and lateral radiographs of the left foot.  SURGEON:  Wylene Simmer, MD  ANESTHESIA:  General, regional.  ESTIMATED BLOOD LOSS:  Minimal.  TOURNIQUET TIME:  53 minutes at 250 mmHg.  COMPLICATIONS:  None apparent.  DISPOSITION:  Extubated, awake, and stable to recovery.  INDICATIONS FOR PROCEDURE:  The patient is a 58 year old woman who has a painful left forefoot.  She has instability of the second and third MTP joints with metatarsalgia and early hammertoe deformity.  She also got deviation of the second toe towards the hallux through the MTP joint. She has a painful bunionette deformity and symptoms consistent with a second webspace Morton neuroma.  She presents now for operative treatment for these painful  conditions.  She understands the risks and benefits, the alternative treatment options, and elects surgical treatment.  She specifically understands risks of bleeding, infection, nerve damage, blood clots, need for additional surgery, amputation, and death.  PROCEDURE IN DETAIL:  After preoperative consent was obtained and the correct operative site was identified, the patient was brought to the operating room and placed supine on the operating table.  General anesthesia was induced.  Preoperative antibiotics were administered. Surgical time-out was taken left lower extremity was prepped and draped in standard sterile fashion with tourniquet around the thigh.  The extremity was exsanguinated and the tourniquet was inflated to 250 mmHg. A longitudinal incision was made over the second webspace.  Sharp dissection was carried down through the skin and subcutaneous tissue. The intermetatarsal ligament was identified.  It was divided under direct vision.  The interdigital nerve was identified.  It was noted to have a fibrotic and thickened appearance.  The decision was made to excise this painful interdigital nerve.  The nerve was dissected distal to the bifurcation and transected at both locations.  Proximally, it was dissected at the level of the interossei and transected.  It was passed off the field.  The wound was irrigated.  Attention was then turned to the second MTP joint where an  arthrotomy was made medial to the extensor tendons.  Joint capsule was opened and the metatarsal head was exposed.  Weil osteotomy was then made with the oscillating saw, removing a small wedge of bone distally.  The metatarsal head was allowed to retract proximally several mm and was fixed with a 2-mm screw.  The overhanging bone was trimmed with a rongeur.  This was then repeated for the third MTP joint, shortening also few mm and fixing it with a 2-mm screw.  At the lateral collateral ligament, the  second MTP joint was then identified.  An imbricating suture of 2-0 Vicryl was placed in the ligament and tagged for later repair.  Attention was then turned to the plantar aspect of the foot where an oblique incision was made from the proximal flexion crease.  Sharp dissection was carried down through skin and subcutaneous tissue.  Blunt dissection was then carried down to the level of the flexor tendon sheath.  Sheath was incised.  Flexor digitorum longus tendon was identified.  It was then released through a stab incision at the distal flexion crease.  It was withdrawn through the proximal incision and split longitudinally.  Both ends were tagged and then passed up along the base of the proximal phalanx through the dorsal incision.  The toe was held down in a corrected position while the tendon was repaired of the dorsum of the proximal phalanx using simple sutures of 2-0 Vicryl. The collateral ligament was then repaired by tying the previously placed suture.  Wounds were irrigated and closed with Monocryl and nylon.  Attention was then turned to the lateral aspect of the forefoot where a longitudinal incision was made.  Sharp dissection was carried down through the skin and subcutaneous tissue.  The lateral joint capsule was incised.  The fifth metatarsal head was identified.  The hypertrophic lateral eminence was resected with the oscillating saw in line with the metatarsal shaft.  AP and lateral radiographs confirmed appropriate correction of the second and third toe deformities as well as excision of bunionette deformity.  Wound was irrigated.  The joint capsule was repaired with imbricating sutures of 3-0 Monocryl.  The skin was closed with nylon.  Sterile dressings were applied followed by compression wrap.  Tourniquet was released after application of the dressings.  The patient was then awakened from anesthesia and transported to the recovery room in stable  condition.  FOLLOWUP PLAN:  The patient will be weightbearing as tolerated on her heel and a Darco shoe.  She will follow up with me in 2 weeks for suture removal.  RADIOGRAPHS:  AP and lateral radiographs of the left foot were obtained intraoperatively.  These show interval shortening of the 2nd and 3rd MTs with screws in place.  No fracture or dislocation is noted.  There has also been interval resection of a bunionette.   Wylene Simmer, MD     JH/MEDQ  D:  02/19/2014  T:  02/20/2014  Job:  [id number]

## 2014-02-23 ENCOUNTER — Encounter (HOSPITAL_BASED_OUTPATIENT_CLINIC_OR_DEPARTMENT_OTHER): Payer: Self-pay | Admitting: Orthopedic Surgery

## 2014-03-23 ENCOUNTER — Other Ambulatory Visit: Payer: Self-pay

## 2014-03-23 DIAGNOSIS — Z1231 Encounter for screening mammogram for malignant neoplasm of breast: Secondary | ICD-10-CM

## 2014-03-25 ENCOUNTER — Encounter (INDEPENDENT_AMBULATORY_CARE_PROVIDER_SITE_OTHER): Payer: Self-pay

## 2014-03-25 ENCOUNTER — Ambulatory Visit
Admission: RE | Admit: 2014-03-25 | Discharge: 2014-03-25 | Disposition: A | Discharge status: Home / Self Care | Payer: 59 | Source: Ambulatory Visit

## 2014-03-25 DIAGNOSIS — Z1231 Encounter for screening mammogram for malignant neoplasm of breast: Secondary | ICD-10-CM

## 2015-04-19 ENCOUNTER — Other Ambulatory Visit: Payer: Self-pay

## 2015-04-19 DIAGNOSIS — Z1231 Encounter for screening mammogram for malignant neoplasm of breast: Secondary | ICD-10-CM

## 2015-05-12 ENCOUNTER — Ambulatory Visit
Admission: RE | Admit: 2015-05-12 | Discharge: 2015-05-12 | Disposition: A | Discharge status: Home / Self Care | Payer: 59 | Source: Ambulatory Visit

## 2015-05-12 DIAGNOSIS — Z1231 Encounter for screening mammogram for malignant neoplasm of breast: Secondary | ICD-10-CM

## 2015-07-15 ENCOUNTER — Other Ambulatory Visit: Payer: Self-pay | Admitting: Obstetrics and Gynecology

## 2015-07-16 LAB — CYTOLOGY - PAP

## 2015-11-07 HISTORY — PX: COLONOSCOPY: SHX174

## 2015-12-10 MED FILL — DILTIAZEM 24HR ER 240 MG CA: 240 | 90 days supply | Qty: 90 | Fill #1

## 2015-12-10 MED FILL — LOSARTAN POTASSIUM 100 MG T: 100 | 90 days supply | Qty: 90 | Fill #3

## 2015-12-10 MED FILL — METOPROLOL SUCC ER 50 MG TA: 50 | 90 days supply | Qty: 90 | Fill #1

## 2015-12-20 ENCOUNTER — Encounter: Payer: Self-pay | Admitting: Internal Medicine

## 2016-01-03 MED FILL — CYCLOBENZAPRINE 10 MG TAB: 10 | 3 days supply | Qty: 10 | Fill #0

## 2016-03-02 ENCOUNTER — Encounter: Payer: 59 | Admitting: Internal Medicine

## 2016-03-07 ENCOUNTER — Encounter: Payer: Self-pay | Admitting: Internal Medicine

## 2016-03-13 MED FILL — METOPROLOL SUCC ER 50 MG TA: 50 | 90 days supply | Qty: 90 | Fill #2

## 2016-03-13 MED FILL — LOSARTAN POTASSIUM 100 MG T: 100 | 90 days supply | Qty: 90 | Fill #4

## 2016-03-13 MED FILL — CARTIA XT 240 MG CAPSULE: 240 | 90 days supply | Qty: 90 | Fill #2

## 2016-03-15 DIAGNOSIS — R8299 Other abnormal findings in urine: Secondary | ICD-10-CM | POA: Diagnosis not present

## 2016-03-15 DIAGNOSIS — Z0000 Encounter for general adult medical examination without abnormal findings: Secondary | ICD-10-CM | POA: Diagnosis not present

## 2016-03-15 DIAGNOSIS — E784 Other hyperlipidemia: Secondary | ICD-10-CM | POA: Diagnosis not present

## 2016-03-20 DIAGNOSIS — I10 Essential (primary) hypertension: Secondary | ICD-10-CM | POA: Diagnosis not present

## 2016-03-20 DIAGNOSIS — R635 Abnormal weight gain: Secondary | ICD-10-CM | POA: Diagnosis not present

## 2016-03-20 DIAGNOSIS — E784 Other hyperlipidemia: Secondary | ICD-10-CM | POA: Diagnosis not present

## 2016-03-20 DIAGNOSIS — Z0000 Encounter for general adult medical examination without abnormal findings: Secondary | ICD-10-CM | POA: Diagnosis not present

## 2016-03-20 DIAGNOSIS — Z6828 Body mass index (BMI) 28.0-28.9, adult: Secondary | ICD-10-CM | POA: Diagnosis not present

## 2016-03-20 DIAGNOSIS — G43009 Migraine without aura, not intractable, without status migrainosus: Secondary | ICD-10-CM | POA: Diagnosis not present

## 2016-03-20 DIAGNOSIS — Z1389 Encounter for screening for other disorder: Secondary | ICD-10-CM | POA: Diagnosis not present

## 2016-03-27 DIAGNOSIS — Z1212 Encounter for screening for malignant neoplasm of rectum: Secondary | ICD-10-CM | POA: Diagnosis not present

## 2016-04-19 ENCOUNTER — Other Ambulatory Visit (HOSPITAL_BASED_OUTPATIENT_CLINIC_OR_DEPARTMENT_OTHER): Payer: Self-pay | Admitting: Obstetrics and Gynecology

## 2016-04-19 DIAGNOSIS — Z1231 Encounter for screening mammogram for malignant neoplasm of breast: Secondary | ICD-10-CM

## 2016-05-15 MED FILL — METOPROLOL SUCC ER 100 MG T: 100 | 90 days supply | Qty: 90 | Fill #0

## 2016-05-16 ENCOUNTER — Ambulatory Visit
Admission: RE | Admit: 2016-05-16 | Discharge: 2016-05-16 | Disposition: A | Discharge status: Home / Self Care | Payer: 59 | Source: Ambulatory Visit | Attending: Obstetrics and Gynecology | Admitting: Obstetrics and Gynecology

## 2016-05-16 DIAGNOSIS — Z1231 Encounter for screening mammogram for malignant neoplasm of breast: Secondary | ICD-10-CM

## 2016-06-15 ENCOUNTER — Encounter: Payer: Self-pay | Admitting: Internal Medicine

## 2016-06-19 MED FILL — LOSARTAN POTASSIUM 100 MG T: 100 | 90 days supply | Qty: 90 | Fill #0

## 2016-06-19 MED FILL — CARTIA XT 240 MG CAPSULE: 240 | 90 days supply | Qty: 90 | Fill #0

## 2016-06-22 DIAGNOSIS — H524 Presbyopia: Secondary | ICD-10-CM | POA: Diagnosis not present

## 2016-07-25 DIAGNOSIS — Z6827 Body mass index (BMI) 27.0-27.9, adult: Secondary | ICD-10-CM | POA: Diagnosis not present

## 2016-07-25 DIAGNOSIS — Z01419 Encounter for gynecological examination (general) (routine) without abnormal findings: Secondary | ICD-10-CM | POA: Diagnosis not present

## 2016-08-08 MED FILL — METOPROLOL SUCC ER 100 MG T: 100 | 90 days supply | Qty: 90 | Fill #0

## 2016-08-10 ENCOUNTER — Ambulatory Visit (AMBULATORY_SURGERY_CENTER): Payer: Self-pay | Admitting: *Deleted

## 2016-08-10 VITALS — Ht 61.0 in | Wt 148.0 lb

## 2016-08-10 DIAGNOSIS — Z86010 Personal history of colonic polyps: Secondary | ICD-10-CM

## 2016-08-10 NOTE — Progress Notes (Signed)
No egg or soy allergy known to patient  No issues with past sedation with any surgeries  or procedures, no intubation problems  No diet pills per patient No home 02 use per patient  No blood thinners per patient  Pt denies issues with constipation on a chronic basis- occ issue but changes diet and corrects itself No A fib or A flutter

## 2016-08-24 ENCOUNTER — Encounter: Payer: Self-pay | Admitting: Internal Medicine

## 2016-08-24 ENCOUNTER — Ambulatory Visit (AMBULATORY_SURGERY_CENTER): Payer: 59 | Admitting: Internal Medicine

## 2016-08-24 VITALS — BP 136/82 | HR 70 | Temp 98.0°F | Resp 24

## 2016-08-24 DIAGNOSIS — E669 Obesity, unspecified: Secondary | ICD-10-CM | POA: Diagnosis not present

## 2016-08-24 DIAGNOSIS — Z86010 Personal history of colonic polyps: Secondary | ICD-10-CM

## 2016-08-24 DIAGNOSIS — I10 Essential (primary) hypertension: Secondary | ICD-10-CM | POA: Diagnosis not present

## 2016-08-24 DIAGNOSIS — D125 Benign neoplasm of sigmoid colon: Secondary | ICD-10-CM

## 2016-08-24 MED ORDER — SODIUM CHLORIDE 0.9 % IV SOLN: 500.0000 mL | INTRAVENOUS | Status: DC

## 2016-08-24 NOTE — Op Note (Signed)
[facility] Patient Name: [name] Procedure Date: 08/24/2016 9:09 AM MRN: [id number] Endoscopist: Gatha Mayer , MD Age: 60 Referring MD:  Date of Birth: January 27, 1956 Gender: Female Account #: [id number] Procedure:                Colonoscopy Indications:              Surveillance: Personal history of adenomatous                            polyps on last colonoscopy 5 years ago Medicines:                Propofol per Anesthesia, Monitored Anesthesia Care Procedure:                Pre-Anesthesia Assessment:                           - Prior to the procedure, a History and Physical                            was performed, and patient medications and                            allergies were reviewed. The patient's tolerance of                            previous anesthesia was also reviewed. The risks                            and benefits of the procedure and the sedation                            options and risks were discussed with the patient.                            All questions were answered, and informed consent                            was obtained. Prior Anticoagulants: The patient has                            taken no previous anticoagulant or antiplatelet                            agents. ASA Grade Assessment: II - A patient with                            mild systemic disease. After reviewing the risks                            and benefits, the patient was deemed in                            satisfactory condition to undergo the procedure.  After obtaining informed consent, the colonoscope                            was passed under direct vision. Throughout the                            procedure, the patient's blood pressure, pulse, and                            oxygen saturations were monitored continuously. The                            Model CF-HQ190L [id number]) scope was introduced   through the anus and advanced to the the cecum,                            identified by appendiceal orifice and ileocecal                            valve. The colonoscopy was performed without                            difficulty. The patient tolerated the procedure                            well. The quality of the bowel preparation was                            good. The bowel preparation used was Miralax. The                            ileocecal valve, appendiceal orifice, and rectum                            were photographed. Scope In: 9:15:44 AM Scope Out: 9:29:48 AM Scope Withdrawal Time: 0 hours 11 minutes 9 seconds  Total Procedure Duration: 0 hours 14 minutes 4 seconds  Findings:                 The perianal and digital rectal examinations were                            normal.                           A 4 mm polyp was found in the sigmoid colon. The                            polyp was sessile. The polyp was removed with a                            cold snare. Resection and retrieval were complete.                            Verification of patient identification  for the                            specimen was done. Estimated blood loss was minimal.                           The exam was otherwise without abnormality on                            direct and retroflexion views. Complications:            No immediate complications. Estimated Blood Loss:     Estimated blood loss was minimal. Impression:               - One 4 mm polyp in the sigmoid colon, removed with                            a cold snare. Resected and retrieved.                           - The examination was otherwise normal on direct                            and retroflexion views.                           - Personal history of colonic polyps. SSP/A AND                            ADENOMA IN 2012 Recommendation:           - Patient has a contact number available for                             emergencies. The signs and symptoms of potential                            delayed complications were discussed with the                            patient. Return to normal activities tomorrow.                            Written discharge instructions were provided to the                            patient.                           - Resume previous diet.                           - Continue present medications.                           - Repeat colonoscopy is recommended for  surveillance. The colonoscopy date will be                            determined after pathology results from today's                            exam become available for review. Gatha Mayer, MD 08/24/2016 9:40:06 AM This report has been signed electronically.

## 2016-08-24 NOTE — Progress Notes (Signed)
Report to PACU, RN, vss, BBS= Clear.  

## 2016-08-24 NOTE — Progress Notes (Signed)
Called to room to assist during endoscopic procedure.  Patient ID and intended procedure confirmed with present staff. Received instructions for my participation in the procedure from the performing physician.Called to room to assist during endoscopic procedure.  Patient ID and intended procedure confirmed with present staff. Received instructions for my participation in the procedure from the performing physician. 

## 2016-08-24 NOTE — Patient Instructions (Addendum)
I found and removed one small polyp that looks benign.  I will let you know pathology results and when to have another routine colonoscopy by mail.  I appreciate the opportunity to care for you. Gatha Mayer, MD, FACG  YOU HAD AN ENDOSCOPIC PROCEDURE TODAY AT [facility]:   Refer to the procedure report that was given to you for any specific questions about what was found during the examination.  If the procedure report does not answer your questions, please call your gastroenterologist to clarify.  If you requested that your care partner not be given the details of your procedure findings, then the procedure report has been included in a sealed envelope for you to review at your convenience later.  YOU SHOULD EXPECT: Some feelings of bloating in the abdomen. Passage of more gas than usual.  Walking can help get rid of the air that was put into your GI tract during the procedure and reduce the bloating. If you had a lower endoscopy (such as a colonoscopy or flexible sigmoidoscopy) you may notice spotting of blood in your stool or on the toilet paper. If you underwent a bowel prep for your procedure, you may not have a normal bowel movement for a few days.  Please Note:  You might notice some irritation and congestion in your nose or some drainage.  This is from the oxygen used during your procedure.  There is no need for concern and it should clear up in a day or so.  SYMPTOMS TO REPORT IMMEDIATELY:   Following lower endoscopy (colonoscopy or flexible sigmoidoscopy):  Excessive amounts of blood in the stool  Significant tenderness or worsening of abdominal pains  Swelling of the abdomen that is new, acute  Fever of 100F or higher   For urgent or emergent issues, a gastroenterologist can be reached at any hour by calling [phone].   DIET:  We do recommend a small meal at first, but then you may proceed to your regular diet.  Drink plenty of fluids but you  should avoid alcoholic beverages for 24 hours.  ACTIVITY:  You should plan to take it easy for the rest of today and you should NOT DRIVE or use heavy machinery until tomorrow (because of the sedation medicines used during the test).    FOLLOW UP: Our staff will call the number listed on your records the next business day following your procedure to check on you and address any questions or concerns that you may have regarding the information given to you following your procedure. If we do not reach you, we will leave a message.  However, if you are feeling well and you are not experiencing any problems, there is no need to return our call.  We will assume that you have returned to your regular daily activities without incident.  If any biopsies were taken you will be contacted by phone or by letter within the next 1-3 weeks.  Please call us at [phone] if you have not heard about the biopsies in 3 weeks.    SIGNATURES/CONFIDENTIALITY: You and/or your care partner have signed paperwork which will be entered into your electronic medical record.  These signatures attest to the fact that that the information above on your After Visit Summary has been reviewed and is understood.  Full responsibility of the confidentiality of this discharge information lies with you and/or your care-partner.  Read all of the handouts given to you by your recovery room  nurse.   Hoyt Koch for choosing [place] for your healthcare needs today.   YOU HAD AN ENDOSCOPIC PROCEDURE TODAY AT [facility]:   Refer to the procedure report that was given to you for any specific questions about what was found during the examination.  If the procedure report does not answer your questions, please call your gastroenterologist to clarify.  If you requested that your care partner not be given the details of your procedure findings, then the procedure report has been included in a sealed envelope for you to review at your  convenience later.  YOU SHOULD EXPECT: Some feelings of bloating in the abdomen. Passage of more gas than usual.  Walking can help get rid of the air that was put into your GI tract during the procedure and reduce the bloating.   Please Note:  You might notice some irritation and congestion in your nose or some drainage.  This is from the oxygen used during your procedure.  There is no need for concern and it should clear up in a day or so.  SYMPTOMS TO REPORT IMMEDIATELY:    Following upper endoscopy (EGD)  Vomiting of blood or coffee ground material  New chest pain or pain under the shoulder blades  Painful or persistently difficult swallowing  New shortness of breath  Fever of 100F or higher  Black, tarry-looking stools  For urgent or emergent issues, a gastroenterologist can be reached at any hour by calling [phone].   DIET:  We do recommend a small meal at first, but then you may proceed to your regular diet.  Drink plenty of fluids but you should avoid alcoholic beverages for 24 hours.  ACTIVITY:  You should plan to take it easy for the rest of today and you should NOT DRIVE or use heavy machinery until tomorrow (because of the sedation medicines used during the test).    FOLLOW UP: Our staff will call the number listed on your records the next business day following your procedure to check on you and address any questions or concerns that you may have regarding the information given to you following your procedure. If we do not reach you, we will leave a message.  However, if you are feeling well and you are not experiencing any problems, there is no need to return our call.  We will assume that you have returned to your regular daily activities without incident.  If any biopsies were taken you will be contacted by phone or by letter within the next 1-3 weeks.  Please call us at [phone] if you have not heard about the biopsies in 3 weeks.     SIGNATURES/CONFIDENTIALITY: You and/or your care partner have signed paperwork which will be entered into your electronic medical record.  These signatures attest to the fact that that the information above on your After Visit Summary has been reviewed and is understood.  Full responsibility of the confidentiality of this discharge information lies with you and/or your care-partner.  Thank-you for choosing [place] for your healthcare needs today.

## 2016-08-25 ENCOUNTER — Telehealth: Payer: Self-pay | Admitting: *Deleted

## 2016-08-25 ENCOUNTER — Telehealth: Payer: Self-pay

## 2016-08-25 NOTE — Telephone Encounter (Signed)
Attempted to reach pt. For follow up call following procedure.   Will try to call again later today.

## 2016-08-25 NOTE — Telephone Encounter (Signed)
  Follow up Call-  Call back number 08/24/2016  Post procedure Call Back phone  # [phone]  Permission to leave phone message Yes  Some recent data might be hidden     Patient questions:  Message left to call us if necessary.  This is call #2.

## 2016-08-29 ENCOUNTER — Encounter: Payer: Self-pay | Admitting: Internal Medicine

## 2016-08-29 DIAGNOSIS — Z86010 Personal history of colonic polyps: Secondary | ICD-10-CM

## 2016-08-29 NOTE — Progress Notes (Signed)
[facility] Recall 2022

## 2016-09-22 DIAGNOSIS — G43009 Migraine without aura, not intractable, without status migrainosus: Secondary | ICD-10-CM | POA: Diagnosis not present

## 2016-09-22 DIAGNOSIS — Z6828 Body mass index (BMI) 28.0-28.9, adult: Secondary | ICD-10-CM | POA: Diagnosis not present

## 2016-09-22 DIAGNOSIS — I10 Essential (primary) hypertension: Secondary | ICD-10-CM | POA: Diagnosis not present

## 2016-09-25 MED FILL — CARTIA XT 240 MG CAPSULE: 240 | 90 days supply | Qty: 90 | Fill #1

## 2016-09-25 MED FILL — LOSARTAN POTASSIUM 100 MG T: 100 | 90 days supply | Qty: 90 | Fill #1

## 2016-11-16 MED FILL — METOPROLOL SUCC ER 100 MG T: 100 | 90 days supply | Qty: 90 | Fill #1

## 2017-01-01 MED FILL — CARTIA XT 240 MG CAPSULE: 240 | 90 days supply | Qty: 90 | Fill #2

## 2017-01-01 MED FILL — LOSARTAN POTASSIUM 100 MG T: 100 | 90 days supply | Qty: 90 | Fill #2

## 2017-02-27 MED FILL — METOPROLOL SUCC ER 100 MG T: 100 | 90 days supply | Qty: 90 | Fill #2

## 2017-03-16 DIAGNOSIS — E784 Other hyperlipidemia: Secondary | ICD-10-CM | POA: Diagnosis not present

## 2017-03-16 DIAGNOSIS — I10 Essential (primary) hypertension: Secondary | ICD-10-CM | POA: Diagnosis not present

## 2017-03-23 DIAGNOSIS — G43009 Migraine without aura, not intractable, without status migrainosus: Secondary | ICD-10-CM | POA: Diagnosis not present

## 2017-03-23 DIAGNOSIS — E784 Other hyperlipidemia: Secondary | ICD-10-CM | POA: Diagnosis not present

## 2017-03-23 DIAGNOSIS — M545 Low back pain: Secondary | ICD-10-CM | POA: Diagnosis not present

## 2017-03-23 DIAGNOSIS — Z1389 Encounter for screening for other disorder: Secondary | ICD-10-CM | POA: Diagnosis not present

## 2017-03-23 DIAGNOSIS — I10 Essential (primary) hypertension: Secondary | ICD-10-CM | POA: Diagnosis not present

## 2017-03-23 DIAGNOSIS — Z6826 Body mass index (BMI) 26.0-26.9, adult: Secondary | ICD-10-CM | POA: Diagnosis not present

## 2017-03-23 DIAGNOSIS — Z0000 Encounter for general adult medical examination without abnormal findings: Secondary | ICD-10-CM | POA: Diagnosis not present

## 2017-03-26 DIAGNOSIS — Z1212 Encounter for screening for malignant neoplasm of rectum: Secondary | ICD-10-CM | POA: Diagnosis not present

## 2017-04-04 MED FILL — CARTIA XT 240 MG CAPSULE: 240 | 90 days supply | Qty: 90 | Fill #0

## 2017-04-04 MED FILL — LOSARTAN POTASSIUM 100 MG T: 100 | 90 days supply | Qty: 90 | Fill #3

## 2017-05-11 ENCOUNTER — Other Ambulatory Visit: Payer: Self-pay | Admitting: Obstetrics and Gynecology

## 2017-05-11 DIAGNOSIS — Z1231 Encounter for screening mammogram for malignant neoplasm of breast: Secondary | ICD-10-CM

## 2017-05-28 ENCOUNTER — Ambulatory Visit
Admission: RE | Admit: 2017-05-28 | Discharge: 2017-05-28 | Disposition: A | Discharge status: Home / Self Care | Payer: 59 | Source: Ambulatory Visit | Attending: Obstetrics and Gynecology | Admitting: Obstetrics and Gynecology

## 2017-05-28 DIAGNOSIS — Z1231 Encounter for screening mammogram for malignant neoplasm of breast: Secondary | ICD-10-CM | POA: Diagnosis not present

## 2017-06-05 MED FILL — METOPROLOL SUCC ER 100 MG T: 100 | 90 days supply | Qty: 90 | Fill #3

## 2017-07-16 MED FILL — CARTIA XT 240 MG CAPSULE: 240 | 90 days supply | Qty: 90 | Fill #1

## 2017-07-16 MED FILL — LOSARTAN POTASSIUM 100 MG T: 100 | 30 days supply | Qty: 30 | Fill #0

## 2017-08-06 DIAGNOSIS — N958 Other specified menopausal and perimenopausal disorders: Secondary | ICD-10-CM | POA: Diagnosis not present

## 2017-08-06 DIAGNOSIS — M8588 Other specified disorders of bone density and structure, other site: Secondary | ICD-10-CM | POA: Diagnosis not present

## 2017-08-06 DIAGNOSIS — Z13820 Encounter for screening for osteoporosis: Secondary | ICD-10-CM | POA: Diagnosis not present

## 2017-08-06 DIAGNOSIS — Z803 Family history of malignant neoplasm of breast: Secondary | ICD-10-CM | POA: Diagnosis not present

## 2017-08-06 DIAGNOSIS — Z6825 Body mass index (BMI) 25.0-25.9, adult: Secondary | ICD-10-CM | POA: Diagnosis not present

## 2017-08-06 DIAGNOSIS — Z86010 Personal history of colonic polyps: Secondary | ICD-10-CM | POA: Diagnosis not present

## 2017-08-06 DIAGNOSIS — Z806 Family history of leukemia: Secondary | ICD-10-CM | POA: Diagnosis not present

## 2017-08-06 DIAGNOSIS — Z01419 Encounter for gynecological examination (general) (routine) without abnormal findings: Secondary | ICD-10-CM | POA: Diagnosis not present

## 2017-08-13 MED FILL — LOSARTAN POTASSIUM 100 MG T: 100 | 30 days supply | Qty: 30 | Fill #1

## 2017-09-10 MED FILL — LOSARTAN POTASSIUM 100 MG T: 100 | 90 days supply | Qty: 90 | Fill #2

## 2017-09-11 MED FILL — METOPROLOL SUCC ER 100 MG T: 100 | 90 days supply | Qty: 90 | Fill #0

## 2017-09-19 DIAGNOSIS — Z809 Family history of malignant neoplasm, unspecified: Secondary | ICD-10-CM | POA: Diagnosis not present

## 2017-10-15 MED FILL — CARTIA XT 240 MG CAPSULE: 240 | 90 days supply | Qty: 90 | Fill #2

## 2018-07-04 ENCOUNTER — Other Ambulatory Visit: Payer: Self-pay | Admitting: Obstetrics and Gynecology

## 2018-07-04 DIAGNOSIS — Z1231 Encounter for screening mammogram for malignant neoplasm of breast: Secondary | ICD-10-CM

## 2018-07-05 ENCOUNTER — Ambulatory Visit
Admission: RE | Admit: 2018-07-05 | Discharge: 2018-07-05 | Disposition: A | Discharge status: Home / Self Care | Payer: Managed Care, Other (non HMO) | Source: Ambulatory Visit | Attending: Obstetrics and Gynecology | Admitting: Obstetrics and Gynecology

## 2018-07-05 DIAGNOSIS — Z1231 Encounter for screening mammogram for malignant neoplasm of breast: Secondary | ICD-10-CM

## 2019-12-05 DIAGNOSIS — M65331 Trigger finger, right middle finger: Secondary | ICD-10-CM | POA: Insufficient documentation

## 2019-12-05 DIAGNOSIS — L608 Other nail disorders: Secondary | ICD-10-CM | POA: Insufficient documentation

## 2019-12-08 ENCOUNTER — Other Ambulatory Visit: Payer: Self-pay | Admitting: Orthopedic Surgery

## 2019-12-08 DIAGNOSIS — L608 Other nail disorders: Secondary | ICD-10-CM

## 2019-12-18 ENCOUNTER — Other Ambulatory Visit: Payer: Self-pay

## 2019-12-18 ENCOUNTER — Ambulatory Visit
Admission: RE | Admit: 2019-12-18 | Discharge: 2019-12-18 | Disposition: A | Discharge status: Home / Self Care | Payer: Managed Care, Other (non HMO) | Source: Ambulatory Visit | Attending: Orthopedic Surgery | Admitting: Orthopedic Surgery

## 2019-12-18 DIAGNOSIS — L608 Other nail disorders: Secondary | ICD-10-CM

## 2021-04-21 ENCOUNTER — Other Ambulatory Visit: Payer: Self-pay | Admitting: Internal Medicine

## 2021-04-21 DIAGNOSIS — I10 Essential (primary) hypertension: Secondary | ICD-10-CM

## 2021-04-21 DIAGNOSIS — E785 Hyperlipidemia, unspecified: Secondary | ICD-10-CM

## 2021-04-21 DIAGNOSIS — Z8249 Family history of ischemic heart disease and other diseases of the circulatory system: Secondary | ICD-10-CM

## 2021-04-28 ENCOUNTER — Other Ambulatory Visit: Payer: Self-pay | Admitting: Internal Medicine

## 2021-04-28 DIAGNOSIS — Z1231 Encounter for screening mammogram for malignant neoplasm of breast: Secondary | ICD-10-CM

## 2021-05-02 ENCOUNTER — Other Ambulatory Visit (HOSPITAL_COMMUNITY): Payer: Self-pay

## 2021-05-02 MED ORDER — MECLIZINE HCL 25 MG PO CHEW: 25.0000 mg | CHEWABLE_TABLET | Freq: Every day | ORAL | 0 refills | Status: AC | PRN

## 2021-06-30 ENCOUNTER — Ambulatory Visit (INDEPENDENT_AMBULATORY_CARE_PROVIDER_SITE_OTHER): Payer: Managed Care, Other (non HMO) | Admitting: Podiatry

## 2021-06-30 ENCOUNTER — Other Ambulatory Visit: Payer: Self-pay

## 2021-06-30 DIAGNOSIS — B351 Tinea unguium: Secondary | ICD-10-CM

## 2021-06-30 NOTE — Patient Instructions (Signed)
Efinaconazole Topical Solution What is this medication? EFINACONAZOLE (e FEE na KON a zole) is an antifungal medicine. It is used totreat certain kinds of fungal infections of the toenail. This medicine may be used for other purposes; ask your health care provider orpharmacist if you have questions. COMMON BRAND NAME(S): JUBLIA What should I tell my care team before I take this medication? They need to know if you have any of these conditions: an unusual or allergic reaction to efinaconazole, other medicines, foods, dyes or preservatives pregnant or trying to get pregnant breast-feeding How should I use this medication? This medicine is for external use only. Do not take by mouth. Follow the directions on the label. Wash hands before and after use. Apply this medicine using the provided brush to cover the entire toenail. Do not use your medicine more often than directed. Finish the full course prescribed by your doctor or health care professional even if you think your condition is better. Do notstop using except on the advice of your doctor or health care professional. Talk to your pediatrician regarding the use of this medicine in children. While this drug may be prescribed for children as young as 6 years for selectedconditions, precautions do apply. Overdosage: If you think you have taken too much of this medicine contact apoison control center or emergency room at once. NOTE: This medicine is only for you. Do not share this medicine with others. What if I miss a dose? If you miss a dose, use it as soon as you can. If it is almost time for yournext dose, use only that dose. Do not use double or extra doses. What may interact with this medication? Interactions have not been studied. Do not use any other nail products (i.e.,nail polish, pedicures) during treatment with this medicine. This list may not describe all possible interactions. Give your health care provider a list of all the medicines,  herbs, non-prescription drugs, or dietary supplements you use. Also tell them if you smoke, drink alcohol, or use illegaldrugs. Some items may interact with your medicine. What should I watch for while using this medication? Do not get this medicine in your eyes. If you do, rinse out with plenty of cooltap water. Tell your doctor or health care professional if your symptoms do not start toget better or if they get worse. Wait for at least 10 minutes after bathing before applying this medication. After bathing, make sure that your feet are very dry. Fungal infections like moist conditions. Do not walk around barefoot. To help prevent reinfection, wear freshly washed cotton, not synthetic clothing. Tell your doctor or health care professional if you develop sores or blisters that do not heal properly. If your nail infection returns after you stop using this medicine, contact yourdoctor or health care professional. What side effects may I notice from receiving this medication? Side effects that you should report to your doctor or health care professionalas soon as possible: allergic reactions like skin rash, itching or hives, swelling of the face, lips, or tongue ingrown toenail Side effects that usually do not require medical attention (report to yourdoctor or health care professional if they continue or are bothersome): mild skin irritation, burning, or itching This list may not describe all possible side effects. Call your doctor for medical advice about side effects. You may report side effects to FDA at1-800-FDA-1088. Where should I keep my medication? Keep out of the reach of children. Store at room temperature between 20 and 25 degrees C (68  and 77 degrees F). Keep this medicine in the original container. Throw away any unused medicineafter the expiration date. This medicine is flammable. Avoid exposure to heat, fire, flame, and smoking. NOTE: This sheet is a summary. It may not cover all  possible information. If you have questions about this medicine, talk to your doctor, pharmacist, orhealth care provider.  2022 Elsevier/Gold Standard (2019-03-03 16:14:11) Terbinafine tablets What is this medication? TERBINAFINE (TER bin a feen) is an antifungal medicine. It is used to treatcertain kinds of fungal or yeast infections. This medicine may be used for other purposes; ask your health care provider orpharmacist if you have questions. COMMON BRAND NAME(S): Lamisil, Terbinex What should I tell my care team before I take this medication? They need to know if you have any of these conditions: drink alcoholic beverages kidney disease liver disease an unusual or allergic reaction to terbinafine, other medicines, foods, dyes, or preservatives pregnant or trying to get pregnant breast-feeding How should I use this medication? Take this medicine by mouth with a full glass of water. Follow the directions on the prescription label. You can take this medicine with food or on an empty stomach. Take your medicine at regular intervals. Do not take your medicine more often than directed. Do not skip doses or stop your medicine early even ifyou feel better. Do not stop taking except on your doctor's advice. A special MedGuide will be given to you by the pharmacist with eachprescription and refill. Be sure to read this information carefully each time. Talk to your pediatrician regarding the use of this medicine in children.Special care may be needed. Overdosage: If you think you have taken too much of this medicine contact apoison control center or emergency room at once. NOTE: This medicine is only for you. Do not share this medicine with others. What if I miss a dose? If you miss a dose, take it as soon as you can. If it is almost time for yournext dose, take only that dose. Do not take double or extra doses. What may interact with this medication? Do not take this medicine with any of the  following medications: thioridazine This medicine may also interact with the following medications: beta-blockers caffeine cimetidine cyclosporine medicines for depression, anxiety, or psychotic disturbances medicines for fungal infections like fluconazole and ketoconazole medicines for irregular heartbeat like amiodarone, flecainide and propafenone rifampin warfarin This list may not describe all possible interactions. Give your health care provider a list of all the medicines, herbs, non-prescription drugs, or dietary supplements you use. Also tell them if you smoke, drink alcohol, or use illegaldrugs. Some items may interact with your medicine. What should I watch for while using this medication? Visit your doctor or health care provider regularly. Tell your doctor right away if you have nausea or vomiting, loss of appetite, stomach pain on your right upper side, yellow skin, dark urine, light stools, or are over tired. Some fungal infections need many weeks or months of treatment to cure. If you are taking this medicine for a long time, you will need to have important bloodwork done. This medicine may cause serious skin reactions. They can happen weeks to months after starting the medicine. Contact your health care provider right away if you notice fevers or flu-like symptoms with a rash. The rash may be red or purple and then turn into blisters or peeling of the skin. Or, you might notice a red rash with swelling of the face, lips or lymph nodes in your  neck or underyour arms. What side effects may I notice from receiving this medication? Side effects that you should report to your doctor or health care professionalas soon as possible: allergic reactions like skin rash or hives, swelling of the face, lips, or tongue changes in vision dark urine fever or infection general ill feeling or flu-like symptoms light-colored stools loss of appetite, nausea rash, fever, and swollen lymph  nodes redness, blistering, peeling or loosening of the skin, including inside the mouth right upper belly pain unusually weak or tired yellowing of the eyes or skin Side effects that usually do not require medical attention (report to yourdoctor or health care professional if they continue or are bothersome): changes in taste diarrhea hair loss muscle or joint pain stomach gas stomach upset This list may not describe all possible side effects. Call your doctor for medical advice about side effects. You may report side effects to FDA at1-800-FDA-1088. Where should I keep my medication? Keep out of the reach of children. Store at room temperature below 25 degrees C (77 degrees F). Protect fromlight. Throw away any unused medicine after the expiration date. NOTE: This sheet is a summary. It may not cover all possible information. If you have questions about this medicine, talk to your doctor, pharmacist, orhealth care provider.  2022 Elsevier/Gold Standard (2019-01-31 15:37:07)

## 2021-06-30 NOTE — Addendum Note (Signed)
Addended by: Wyman Songster T on: 06/30/2021 04:13 PM   Modules accepted: Orders

## 2021-06-30 NOTE — Progress Notes (Signed)
Subjective:   Patient ID: [name], female   DOB: 65 y.o.   MRN: [id number]   HPI 64 year old female presents the office today for concerns of toenail fungus which is been ongoing for last couple of years.  She said that she is tried numerous over-the-counter treatments as well as soaking in Listerine.  She thinks this helps however the symptoms continue.  Occasional discomfort at her right big toenail but currently no pain.  No swelling or redness or any drainage.  She has no other concerns today.   Review of Systems  All other systems reviewed and are negative.  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   METATARSAL OSTEOTOMY  2015   POLYPECTOMY     TRIGGER FINGER RELEASE Left 11/03/2013   Procedure: RELEASE A-1 PULLEY LEFT THUMB AND EXCISION CYST LEFT RING FINGER;  Surgeon: Wynonia Sours, MD;  Location: [facility];  Service: Orthopedics;  Laterality: Left;   TUBAL LIGATION  1995   VAGINAL HYSTERECTOMY  2009   WISDOM TOOTH EXTRACTION  2010     Current Outpatient Medications:    amLODipine (NORVASC) 5 MG tablet, Take 5 mg by mouth daily., Disp: , Rfl:    losartan (COZAAR) 100 MG tablet, Take 100 mg by mouth daily., Disp: , Rfl:    losartan (COZAAR) 100 MG tablet, Take 1 tablet by mouth daily., Disp: ,  Rfl:    Meclizine HCl 25 MG CHEW, Chew 1 tablet (25 mg total) by mouth daily as needed., Disp: 30 tablet, Rfl: 0   metoprolol (LOPRESSOR) 100 MG tablet, Take 100 mg by mouth daily., Disp: , Rfl:    metoprolol succinate (TOPROL-XL) 100 MG 24 hr tablet, Take 100 mg by mouth daily., Disp: , Rfl:    mometasone (ELOCON) 0.1 % ointment, Apply topically daily., Disp: , Rfl:    olmesartan (BENICAR) 40 MG tablet, Take 40 mg by mouth daily., Disp: , Rfl:    diltiazem (CARDIZEM CD) 240 MG 24 hr capsule, Take 240 mg by mouth daily.   (Patient not taking: Reported on 06/30/2021), Disp: , Rfl:   Current Facility-Administered Medications:    0.9 %  sodium chloride infusion, 500 mL, Intravenous, Continuous, Gatha Mayer, MD  Allergies  Allergen Reactions   Lisinopril     REACTION: hives, itching   Morphine     REACTION: hives   Parafon Forte Dsc [Chlorzoxazone] Swelling    Face and neck swelling           Objective:  Physical Exam  General: AAO x3, NAD  Dermatological: Bilateral hallux, left third, right fifth nails are hypertrophic, dystrophic with brown discoloration.  On the right hallux toenail some mild white discoloration as well.  There is no edema, erythema,  drainage or pus or any signs of infection.  No open lesions.  Vascular: Dorsalis Pedis artery and Posterior Tibial artery pedal pulses are 2/4 bilateral with immedate capillary fill time. There is no pain with calf compression, swelling, warmth, erythema.   Neruologic: Grossly intact via light touch bilateral.   Musculoskeletal: No gross boney pedal deformities bilateral. No pain, crepitus, or limitation noted with foot and ankle range of motion bilateral. Muscular strength 5/5 in all groups tested bilateral.  Gait: Unassisted, Nonantalgic.       Assessment:   65 year old female with onychomycosis     Plan:  -Treatment options discussed including all alternatives, risks, and complications -Etiology of symptoms were  discussed -We discussed oral, topical as well as alternative treatments.  After prior to starting medications out the culture of the nails.  I sharply debrided the symptomatic nails and sent this for culture, pathology to [facility]   Trula Slade DPM

## 2021-07-04 ENCOUNTER — Ambulatory Visit
Admission: RE | Admit: 2021-07-04 | Discharge: 2021-07-04 | Disposition: A | Discharge status: Home / Self Care | Payer: Self-pay | Source: Ambulatory Visit | Attending: Internal Medicine | Admitting: Internal Medicine

## 2021-07-04 DIAGNOSIS — I10 Essential (primary) hypertension: Secondary | ICD-10-CM

## 2021-07-04 DIAGNOSIS — Z8249 Family history of ischemic heart disease and other diseases of the circulatory system: Secondary | ICD-10-CM

## 2021-07-04 DIAGNOSIS — E785 Hyperlipidemia, unspecified: Secondary | ICD-10-CM

## 2021-07-14 ENCOUNTER — Encounter: Payer: Self-pay | Admitting: Podiatry

## 2021-07-15 ENCOUNTER — Other Ambulatory Visit: Payer: Self-pay | Admitting: Podiatry

## 2021-07-15 ENCOUNTER — Other Ambulatory Visit: Payer: Self-pay | Admitting: Internal Medicine

## 2021-07-15 DIAGNOSIS — Z1231 Encounter for screening mammogram for malignant neoplasm of breast: Secondary | ICD-10-CM

## 2021-07-15 DIAGNOSIS — Z79899 Other long term (current) drug therapy: Secondary | ICD-10-CM

## 2021-07-19 ENCOUNTER — Other Ambulatory Visit: Payer: Self-pay | Admitting: Podiatry

## 2021-07-19 ENCOUNTER — Other Ambulatory Visit (HOSPITAL_COMMUNITY): Payer: Self-pay

## 2021-07-19 ENCOUNTER — Encounter: Payer: Self-pay | Admitting: Podiatry

## 2021-07-19 DIAGNOSIS — Z79899 Other long term (current) drug therapy: Secondary | ICD-10-CM

## 2021-07-19 LAB — CBC WITH DIFFERENTIAL/PLATELET
Basophils Absolute: 0 10*3/uL (ref 0.0–0.2)
Basos: 0 %
EOS (ABSOLUTE): 0.1 10*3/uL (ref 0.0–0.4)
Eos: 1 %
Hematocrit: 40.6 % (ref 34.0–46.6)
Hemoglobin: 13.5 g/dL (ref 11.1–15.9)
Immature Grans (Abs): 0 10*3/uL (ref 0.0–0.1)
Immature Granulocytes: 0 %
Lymphocytes Absolute: 1.5 10*3/uL (ref 0.7–3.1)
Lymphs: 30 %
MCH: 31.5 pg (ref 26.6–33.0)
MCHC: 33.3 g/dL (ref 31.5–35.7)
MCV: 95 fL (ref 79–97)
Monocytes Absolute: 0.3 10*3/uL (ref 0.1–0.9)
Monocytes: 6 %
Neutrophils Absolute: 3 10*3/uL (ref 1.4–7.0)
Neutrophils: 63 %
Platelets: 215 10*3/uL (ref 150–450)
RBC: 4.29 x10E6/uL (ref 3.77–5.28)
RDW: 12.6 % (ref 11.7–15.4)
WBC: 5 10*3/uL (ref 3.4–10.8)

## 2021-07-19 LAB — HEPATIC FUNCTION PANEL
ALT: 20 IU/L (ref 0–32)
AST: 21 IU/L (ref 0–40)
Albumin: 4.4 g/dL (ref 3.8–4.8)
Alkaline Phosphatase: 99 IU/L (ref 44–121)
Bilirubin Total: 0.3 mg/dL (ref 0.0–1.2)
Bilirubin, Direct: 0.11 mg/dL (ref 0.00–0.40)
Total Protein: 6.3 g/dL (ref 6.0–8.5)

## 2021-07-19 MED ORDER — TERBINAFINE HCL 250 MG PO TABS: 250.0000 mg | ORAL_TABLET | Freq: Every day | ORAL | 0 refills | Status: DC

## 2021-07-19 MED ORDER — TERBINAFINE HCL 250 MG PO TABS
250.0000 mg | ORAL_TABLET | Freq: Every day | ORAL | 0 refills | Status: DC
  Filled 2021-07-19: qty 30, 30d supply, fill #0

## 2021-07-20 ENCOUNTER — Ambulatory Visit: Payer: Managed Care, Other (non HMO) | Admitting: Cardiology

## 2021-07-20 ENCOUNTER — Other Ambulatory Visit (HOSPITAL_COMMUNITY): Payer: Self-pay

## 2021-07-20 ENCOUNTER — Ambulatory Visit: Payer: Managed Care, Other (non HMO) | Admitting: Student

## 2021-07-20 ENCOUNTER — Encounter: Payer: Self-pay | Admitting: Student

## 2021-07-20 ENCOUNTER — Other Ambulatory Visit: Payer: Self-pay

## 2021-07-20 DIAGNOSIS — E7800 Pure hypercholesterolemia, unspecified: Secondary | ICD-10-CM

## 2021-07-20 DIAGNOSIS — R931 Abnormal findings on diagnostic imaging of heart and coronary circulation: Secondary | ICD-10-CM

## 2021-07-20 DIAGNOSIS — R600 Localized edema: Secondary | ICD-10-CM

## 2021-07-20 DIAGNOSIS — I700 Atherosclerosis of aorta: Secondary | ICD-10-CM

## 2021-07-20 NOTE — Progress Notes (Signed)
Primary Physician/Referring:  Leanna Battles, MD  Patient ID: [name], female    DOB: 1956/02/21, 65 y.o.   MRN: [id number]  Chief Complaint  Patient presents with   Coronary Artery Disease   New Patient (Initial Visit)   HPI:    [name]  is a 65 y.o. female with history of hypertension, hyperlipidemia, family history of premature CAD, and gestational diabetes, status post hysterectomy in 2009.  Patient is referred to our office for further evaluation and management given elevated coronary calcium score in the 79th percentile.  Patient is asymptomatic without chest pain, dyspnea, syncope, near syncope.  She has no formal exercise routine but frequently works in the yard and works in the [employer] on her feet for extended periods of time without issue.  She does report myalgias with 2 previously tried statin medications, however she cannot remember the names of the statin therapy she has tried.  She started Crestor 2 days ago and is tolerating it well thus far.  She also reports intermittent bilateral ankle edema.  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 POSSIBLE HAMMERTOE CORRECTION,LEFT BONIONETTE CORRECTION;  Surgeon: Wylene Simmer, MD;  Location: [place];  Service: Orthopedics;  Laterality: Left;   KNEE ARTHROSCOPY  2012   Left knee   KNEE ARTHROSCOPY  2011   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   METATARSAL OSTEOTOMY  2015   POLYPECTOMY     TRIGGER FINGER RELEASE Left 11/03/2013   Procedure: RELEASE A-1 PULLEY LEFT THUMB AND EXCISION CYST LEFT RING FINGER;   Surgeon: Wynonia Sours, MD;  Location: [place];  Service: Orthopedics;  Laterality: Left;   TUBAL LIGATION  1995   VAGINAL HYSTERECTOMY  2009   WISDOM TOOTH EXTRACTION  2010   Family History  Problem Relation Age of Onset   Diabetes Mother    Hypertension Mother    Heart disease Father    Hypertension Father    Hyperlipidemia Father    Diabetes Sister    Diabetes Sister    Diabetes Sister    Colon cancer Neg Hx    Colon polyps Neg Hx    Esophageal cancer Neg Hx    Rectal cancer Neg Hx    Stomach cancer Neg Hx     Social History   Tobacco Use   Smoking status: Never   Smokeless tobacco: Never  Substance Use Topics   Alcohol use: Yes    Comment: Has 1 drink every now and then of wine    Marital Status: Married   ROS  Review of Systems  Constitutional: Negative for malaise/fatigue and weight gain.  Cardiovascular:  Positive for leg swelling (bilateral ankles). Negative for chest pain, claudication, near-syncope, orthopnea, palpitations, paroxysmal nocturnal dyspnea and syncope.  Respiratory:  Negative for shortness of breath.   Neurological:  Negative for dizziness.   Objective  Blood pressure 134/79, pulse 69, temperature 98.3 F (36.8 C), height '5\' 1"'$  (1.549 m), weight 145 lb 3.2 oz (65.9 kg), SpO2 97 %.  Vitals with BMI 07/20/2021 08/24/2016 08/24/2016  Height '5\' 1"'$  - -  Weight 145 lbs 3 oz - -  BMI Q000111Q - -  Systolic [id number]  Diastolic 79 82 90  Pulse 69 - 70      Physical Exam Vitals reviewed.  HENT:     Head: Normocephalic and atraumatic.  Cardiovascular:     Rate and Rhythm: Normal rate and regular rhythm.     Pulses: Intact distal pulses.     Heart sounds: S1 normal and S2 normal. No murmur heard.   No gallop.  Pulmonary:     Effort: Pulmonary effort is normal. No respiratory distress.     Breath sounds: No wheezing, rhonchi or rales.  Musculoskeletal:     Right lower leg: Edema (minimal) present.     Left lower leg: Edema  (minimal) present.  Neurological:     Mental Status: She is alert.    Laboratory examination:   No results for input(s): NA, K, CL, CO2, GLUCOSE, BUN, CREATININE, CALCIUM, GFRNONAA, GFRAA in the last 8760 hours. CrCl cannot be calculated (Patient's most recent lab result is older than the maximum 21 days allowed.).  CMP Latest Ref Rng & Units 07/18/2021 10/29/2013 01/12/2011  Glucose 70 - 99 mg/dL - 106(H) 104(H)  BUN 6 - 23 mg/dL - 23 11  Creatinine 0.50 - 1.10 mg/dL - 1.00 0.67  Sodium 135 - 145 mEq/L - 142 138  Potassium 3.5 - 5.1 mEq/L - 5.0 4.5  Chloride 96 - 112 mEq/L - 103 102  CO2 19 - 32 mEq/L - 32 29  Calcium 8.4 - 10.5 mg/dL - 9.3 9.5  Total Protein 6.0 - 8.5 g/dL 6.3 - -  Total Bilirubin 0.0 - 1.2 mg/dL 0.3 - -  Alkaline Phos 44 - 121 IU/L 99 - -  AST 0 - 40 IU/L 21 - -  ALT 0 - 32 IU/L 20 - -   CBC Latest Ref Rng & Units 07/18/2021 02/19/2014 11/03/2013  WBC 3.4 - 10.8 x10E3/uL 5.0 - -  Hemoglobin 11.1 - 15.9 g/dL 13.5 15.5(H) 14.2  Hematocrit 34.0 - 46.6 % 40.6 - -  Platelets 150 - 450 x10E3/uL 215 - -    Lipid Panel No results for input(s): CHOL, TRIG, LDLCALC, VLDL, HDL, CHOLHDL, LDLDIRECT in the last 8760 hours.  HEMOGLOBIN A1C No results found for: HGBA1C, MPG TSH No results for input(s): TSH in the last 8760 hours.  External labs:   04/12/2021: Total cholesterol 230, triglycerides 126, HDL 55, LDL 150 Glucose 99, BUN 14, creatinine 0.8, GFR >60, sodium 140, potassium 4.7 Hgb 14.6, HCT 42.6, platelet 190  Allergies   Allergies  Allergen Reactions   Lisinopril Hives   Morphine Hives   Parafon Forte Dsc [Chlorzoxazone] Swelling         Medications Prior to Visit:   Outpatient Medications Prior to Visit  Medication Sig Dispense Refill   amLODipine (NORVASC) 5 MG tablet Take 5 mg by mouth daily.     Meclizine HCl 25 MG CHEW Chew 1 tablet (25 mg total) by mouth daily as needed. 30 tablet 0   metoprolol succinate (TOPROL-XL) 100 MG 24 hr tablet Take  100 mg by mouth daily.     mometasone (ELOCON) 0.1 % ointment Apply topically daily.     olmesartan (BENICAR) 40 MG tablet Take 40 mg by mouth daily.     rosuvastatin (CRESTOR) 20 MG tablet Take 20 mg by mouth daily.     terbinafine (LAMISIL)  250 MG tablet Take 1 tablet (250 mg total) by mouth daily. 90 tablet 0   diltiazem (CARDIZEM CD) 240 MG 24 hr capsule Take 240 mg by mouth daily.     losartan (COZAAR) 100 MG tablet Take 100 mg by mouth daily.     losartan (COZAAR) 100 MG tablet Take 1 tablet by mouth daily.     metoprolol (LOPRESSOR) 100 MG tablet Take 100 mg by mouth daily.     Facility-Administered Medications Prior to Visit  Medication Dose Route Frequency Provider Last Rate Last Admin   0.9 %  sodium chloride infusion  500 mL Intravenous Continuous Gatha Mayer, MD       Final Medications at End of Visit    Current Meds  Medication Sig   amLODipine (NORVASC) 5 MG tablet Take 5 mg by mouth daily.   Meclizine HCl 25 MG CHEW Chew 1 tablet (25 mg total) by mouth daily as needed.   metoprolol succinate (TOPROL-XL) 100 MG 24 hr tablet Take 100 mg by mouth daily.   mometasone (ELOCON) 0.1 % ointment Apply topically daily.   olmesartan (BENICAR) 40 MG tablet Take 40 mg by mouth daily.   rosuvastatin (CRESTOR) 20 MG tablet Take 20 mg by mouth daily.   terbinafine (LAMISIL) 250 MG tablet Take 1 tablet (250 mg total) by mouth daily.   [DISCONTINUED] diltiazem (CARDIZEM CD) 240 MG 24 hr capsule Take 240 mg by mouth daily.   Current Facility-Administered Medications for the 07/20/21 encounter (Office Visit) with Alethia Berthold, PA-C  Medication   0.9 %  sodium chloride infusion   Radiology:   No results found.  Cardiac Studies:   Coronary calcium score 07/04/2021: Left Main: 0 LAD: 53-dense calcification in the proximal LAD on 25/2 LCx: 0 RCA: 0 1. Total Agatston score of 67.5, corresponding to 79th percentile for age, sex, and race based cohort. 2. Aortic  Atherosclerosis (ICD10-I70.0).  EKG:   07/20/2021: Sinus rhythm at a rate of 63 bpm.  Normal axis.  Nonspecific T wave abnormality.  No evidence of ischemia or underlying injury pattern.  Assessment     ICD-10-CM   1. Aortic atherosclerosis (HCC)  I70.0 EKG 12-Lead    2. Elevated coronary artery calcium score  R93.1 PCV MYOCARDIAL PERFUSION WO LEXISCAN    PCV ECHOCARDIOGRAM COMPLETE    Lipid Panel With LDL/HDL Ratio    3. Bilateral leg edema  R60.0 PCV ECHOCARDIOGRAM COMPLETE    4. Hypercholesteremia  E78.00 Lipid Panel With LDL/HDL Ratio       Medications Discontinued During This Encounter  Medication Reason   losartan (COZAAR) 100 MG tablet Error   diltiazem (CARDIZEM CD) 240 MG 24 hr capsule Error   losartan (COZAAR) 100 MG tablet Error   metoprolol (LOPRESSOR) 100 MG tablet Error    No orders of the defined types were placed in this encounter.   Recommendations:   [name] is a 65 y.o. female with history of hypertension, hyperlipidemia, family history of premature CAD, and gestational diabetes, status post hysterectomy in 2009.  Patient is referred to our office for further evaluation and management given elevated coronary calcium score in the 79th percentile.  Patient is asymptomatic, however given elevated coronary calcium score recommend proceeding with nuclear stress test.  She also reports bilateral leg swelling, will therefore obtain echocardiogram as well.  I personally reviewed external labs, lipids are uncontrolled, agree with initiation of Crestor and will plan to repeat lipid profile testing in 6 weeks.  Recommend LDL goal less than 70 given elevated coronary calcium score.  Could consider addition of Zetia if needed following repeat lipid profile testing.  We will hold off on further recommendations pending results of stress test and echocardiogram as well as repeat lipid profile testing.  Regard to blood pressure, patient's blood pressure was minimally  elevated at today's office visit.  Advised her to monitor blood pressure on a daily basis at home and notify our office if it is >130/80 mmHg consistently.  Further recommendations pending results of cardiovascular testing.  During this visit I reviewed and updated: Tobacco history  allergies medication reconciliation  medical history  surgical history  family history  social history.  This note was created using a voice recognition software as a result there may be grammatical errors inadvertently enclosed that do not reflect the nature of this encounter. Every attempt is made to correct such errors.   Alethia Berthold, PA-C 07/20/2021, 3:12 PM Office: [phone]

## 2021-08-09 ENCOUNTER — Other Ambulatory Visit (HOSPITAL_COMMUNITY): Payer: Self-pay

## 2021-08-15 ENCOUNTER — Ambulatory Visit: Payer: Managed Care, Other (non HMO)

## 2021-08-15 ENCOUNTER — Other Ambulatory Visit: Payer: Self-pay

## 2021-08-15 DIAGNOSIS — R931 Abnormal findings on diagnostic imaging of heart and coronary circulation: Secondary | ICD-10-CM

## 2021-08-15 DIAGNOSIS — R600 Localized edema: Secondary | ICD-10-CM

## 2021-08-16 ENCOUNTER — Encounter: Payer: Self-pay | Admitting: Internal Medicine

## 2021-08-17 ENCOUNTER — Other Ambulatory Visit: Payer: Self-pay

## 2021-08-17 ENCOUNTER — Ambulatory Visit
Admission: RE | Admit: 2021-08-17 | Discharge: 2021-08-17 | Disposition: A | Discharge status: Home / Self Care | Payer: Managed Care, Other (non HMO) | Source: Ambulatory Visit

## 2021-08-17 DIAGNOSIS — Z1231 Encounter for screening mammogram for malignant neoplasm of breast: Secondary | ICD-10-CM

## 2021-09-01 LAB — LIPID PANEL WITH LDL/HDL RATIO
Cholesterol, Total: 167 mg/dL (ref 100–199)
HDL: 59 mg/dL (ref >39)
LDL Chol Calc (NIH): 82 mg/dL (ref 0–99)
LDL/HDL Ratio: 1.4 ratio (ref 0.0–3.2)
Triglycerides: 153 mg/dL — ABNORMAL HIGH (ref 0–149)
VLDL Cholesterol Cal: 26 mg/dL (ref 5–40)

## 2021-09-01 LAB — CBC WITH DIFFERENTIAL/PLATELET
Basophils Absolute: 0 10*3/uL (ref 0.0–0.2)
Basos: 1 %
EOS (ABSOLUTE): 0.1 10*3/uL (ref 0.0–0.4)
Eos: 2 %
Hematocrit: 40.9 % (ref 34.0–46.6)
Hemoglobin: 13.4 g/dL (ref 11.1–15.9)
Immature Grans (Abs): 0 10*3/uL (ref 0.0–0.1)
Immature Granulocytes: 0 %
Lymphocytes Absolute: 1.1 10*3/uL (ref 0.7–3.1)
Lymphs: 30 %
MCH: 30.9 pg (ref 26.6–33.0)
MCHC: 32.8 g/dL (ref 31.5–35.7)
MCV: 94 fL (ref 79–97)
Monocytes Absolute: 0.3 10*3/uL (ref 0.1–0.9)
Monocytes: 8 %
Neutrophils Absolute: 2.2 10*3/uL (ref 1.4–7.0)
Neutrophils: 59 %
Platelets: 217 10*3/uL (ref 150–450)
RBC: 4.34 x10E6/uL (ref 3.77–5.28)
RDW: 12.4 % (ref 11.7–15.4)
WBC: 3.7 10*3/uL (ref 3.4–10.8)

## 2021-09-01 LAB — HEPATIC FUNCTION PANEL
ALT: 44 IU/L — ABNORMAL HIGH (ref 0–32)
AST: 30 IU/L (ref 0–40)
Albumin: 4.4 g/dL (ref 3.8–4.8)
Alkaline Phosphatase: 82 IU/L (ref 44–121)
Bilirubin Total: 0.3 mg/dL (ref 0.0–1.2)
Bilirubin, Direct: 0.12 mg/dL (ref 0.00–0.40)
Total Protein: 6.4 g/dL (ref 6.0–8.5)

## 2021-09-02 ENCOUNTER — Other Ambulatory Visit: Payer: Self-pay | Admitting: Podiatry

## 2021-09-02 DIAGNOSIS — Z79899 Other long term (current) drug therapy: Secondary | ICD-10-CM

## 2021-09-13 NOTE — Progress Notes (Signed)
Primary Physician/Referring:  Leanna Battles, MD  Patient ID: [name], female    DOB: 26-Aug-1956, 65 y.o.   MRN: [id number]  Chief Complaint  Patient presents with   Aortic atherosclerosis   Follow-up    8 weeks   HPI:    [name]  is a 65 y.o. female with history of hypertension, hyperlipidemia, family history of premature CAD, and gestational diabetes, status post hysterectomy in 2009.  Patient is referred to our office for further evaluation and management given elevated coronary calcium score in the 79th percentile.  Presents for follow-up and results of cardiac testing.  Last office visit ordered nuclear stress test given elevated coronary calcium score, stress test was overall low risk. Also last office visit given bilateral leg edema ordered echocardiogram which noted preserved LVEF at 60-65% and mild valvular disease.  Patient was started on Crestor and repeat lipid profile testing showed LDL 82, which is relatively well controlled. however, triglycerirdes were above goal.   Patient is feeling well without specific complaints today.  She brings with her written log of home blood pressure readings which remain uncontrolled.  She is tolerating present medications without issue.  Denies chest pain, palpitations, syncope, near syncope, dyspnea.  Recently retired from working in the [place].  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 POSSIBLE HAMMERTOE CORRECTION,LEFT BONIONETTE CORRECTION;  Surgeon: Wylene Simmer, MD;  Location: [place];  Service: Orthopedics;   Laterality: Left;   KNEE ARTHROSCOPY  2012   Left knee   KNEE ARTHROSCOPY  2011   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   METATARSAL OSTEOTOMY  2015   POLYPECTOMY     TRIGGER FINGER RELEASE Left 11/03/2013   Procedure: RELEASE A-1 PULLEY LEFT THUMB AND EXCISION CYST LEFT RING FINGER;  Surgeon: Wynonia Sours, MD;  Location: [place];  Service: Orthopedics;  Laterality: Left;   TUBAL LIGATION  1995   VAGINAL HYSTERECTOMY  2009   WISDOM TOOTH EXTRACTION  2010   Family History  Problem Relation Age of Onset   Diabetes Mother    Hypertension Mother    Heart disease Father    Hypertension Father    Hyperlipidemia Father    Diabetes Sister    Diabetes Sister    Diabetes Sister    Colon cancer Neg Hx    Colon polyps Neg Hx    Esophageal cancer Neg Hx    Rectal cancer Neg Hx    Stomach cancer Neg Hx     Social History   Tobacco Use   Smoking status: Never   Smokeless tobacco: Never  Substance Use Topics   Alcohol use: Yes    Comment: Has 1 drink every now and then of wine    Marital Status: Married   ROS  Review of Systems  Constitutional: Negative for malaise/fatigue and weight gain.  Cardiovascular:  Negative for chest pain, claudication, leg swelling, near-syncope, orthopnea, palpitations, paroxysmal nocturnal dyspnea and syncope.  Respiratory:  Negative for shortness of breath.  Neurological:  Negative for dizziness.   Objective  Blood pressure (!) 168/90, pulse (!) 56, temperature 98 F (36.7 C), resp. rate 16, height 5\' 1"  (1.549 m), weight 149 lb (67.6 kg), SpO2 98 %.  Vitals with BMI 09/14/2021 09/14/2021 07/20/2021  Height - 5\' 1"  5\' 1"   Weight - 149 lbs 145 lbs 3 oz  BMI - 14.48 18.56  Systolic 314 970 263  Diastolic 90 83 79  Pulse 56 58 69      Physical Exam Vitals reviewed.  HENT:     Head: Normocephalic and atraumatic.  Cardiovascular:     Rate and Rhythm: Normal rate and regular rhythm.     Pulses: Intact distal pulses.     Heart  sounds: S1 normal and S2 normal. No murmur heard.   No gallop.  Pulmonary:     Effort: Pulmonary effort is normal. No respiratory distress.     Breath sounds: No wheezing, rhonchi or rales.  Musculoskeletal:     Right lower leg: Edema (minimal) present.     Left lower leg: Edema (minimal) present.  Neurological:     Mental Status: She is alert.  Physical exam unchanged compared to previous office visit.  Laboratory examination:   No results for input(s): NA, K, CL, CO2, GLUCOSE, BUN, CREATININE, CALCIUM, GFRNONAA, GFRAA in the last 8760 hours. CrCl cannot be calculated (Patient's most recent lab result is older than the maximum 21 days allowed.).  CMP Latest Ref Rng & Units 08/31/2021 07/18/2021 10/29/2013  Glucose 70 - 99 mg/dL - - 106(H)  BUN 6 - 23 mg/dL - - 23  Creatinine 0.50 - 1.10 mg/dL - - 1.00  Sodium 135 - 145 mEq/L - - 142  Potassium 3.5 - 5.1 mEq/L - - 5.0  Chloride 96 - 112 mEq/L - - 103  CO2 19 - 32 mEq/L - - 32  Calcium 8.4 - 10.5 mg/dL - - 9.3  Total Protein 6.0 - 8.5 g/dL 6.4 6.3 -  Total Bilirubin 0.0 - 1.2 mg/dL 0.3 0.3 -  Alkaline Phos 44 - 121 IU/L 82 99 -  AST 0 - 40 IU/L 30 21 -  ALT 0 - 32 IU/L 44(H) 20 -   CBC Latest Ref Rng & Units 08/31/2021 07/18/2021 02/19/2014  WBC 3.4 - 10.8 x10E3/uL 3.7 5.0 -  Hemoglobin 11.1 - 15.9 g/dL 13.4 13.5 15.5(H)  Hematocrit 34.0 - 46.6 % 40.9 40.6 -  Platelets 150 - 450 x10E3/uL 217 215 -    Lipid Panel Recent Labs    08/31/21 0831  CHOL 167  TRIG 153*  LDLCALC 82  HDL 59    HEMOGLOBIN A1C No results found for: HGBA1C, MPG TSH No results for input(s): TSH in the last 8760 hours.  External labs:   04/12/2021: Total cholesterol 230, triglycerides 126, HDL 55, LDL 150 Glucose 99, BUN 14, creatinine 0.8, GFR >60, sodium 140, potassium 4.7 Hgb 14.6, HCT 42.6, platelet 190  Allergies   Allergies  Allergen Reactions   Lisinopril Hives   Morphine Hives   Parafon Forte Dsc [Chlorzoxazone] Swelling          Medications Prior to Visit:   Outpatient Medications Prior to Visit  Medication Sig Dispense Refill   amLODipine (NORVASC) 5 MG tablet Take 5 mg by mouth daily.     Meclizine HCl 25 MG CHEW Chew 1 tablet (25 mg total) by mouth daily as needed. 30 tablet 0   metoprolol succinate (TOPROL-XL) 100 MG 24 hr tablet Take 100  mg by mouth daily.     mupirocin cream (BACTROBAN) 2 % Apply 1 application topically 2 (two) times daily.     olmesartan (BENICAR) 40 MG tablet Take 40 mg by mouth daily.     rosuvastatin (CRESTOR) 20 MG tablet Take 20 mg by mouth daily.     mometasone (ELOCON) 0.1 % ointment Apply topically daily.     terbinafine (LAMISIL) 250 MG tablet Take 1 tablet (250 mg total) by mouth daily. 90 tablet 0   Facility-Administered Medications Prior to Visit  Medication Dose Route Frequency Provider Last Rate Last Admin   0.9 %  sodium chloride infusion  500 mL Intravenous Continuous Gatha Mayer, MD       Final Medications at End of Visit    Current Meds  Medication Sig   amLODipine (NORVASC) 5 MG tablet Take 5 mg by mouth daily.   hydrochlorothiazide (MICROZIDE) 12.5 MG capsule Take 1 capsule (12.5 mg total) by mouth daily.   Meclizine HCl 25 MG CHEW Chew 1 tablet (25 mg total) by mouth daily as needed.   metoprolol succinate (TOPROL-XL) 100 MG 24 hr tablet Take 100 mg by mouth daily.   mupirocin cream (BACTROBAN) 2 % Apply 1 application topically 2 (two) times daily.   olmesartan (BENICAR) 40 MG tablet Take 40 mg by mouth daily.   rosuvastatin (CRESTOR) 20 MG tablet Take 20 mg by mouth daily.   Current Facility-Administered Medications for the 09/14/21 encounter (Office Visit) with Alethia Berthold, PA-C  Medication   0.9 %  sodium chloride infusion   Radiology:   No results found.  Cardiac Studies:   Coronary calcium score 07/04/2021: Left Main: 0 LAD: 53-dense calcification in the proximal LAD on 25/2 LCx: 0 RCA: 0 1. Total Agatston score of 67.5, corresponding  to 79th percentile for age, sex, and race based cohort. 2. Aortic Atherosclerosis (ICD10-I70.0).  PCV MYOCARDIAL PERFUSION WO LEXISCAN 08/15/2021   Exercise Tetrofosmin stress test 08/16/2021: Exercise nuclear stress test was performed using Bruce protocol. Patient reached 6.7 METS, and 106% of age predicted maximum heart rate. Exercise capacity was low. No chest pain reported. Heart rate and hemodynamic response were normal. Stress EKG revealed no ischemic changes. SPECT images show decreased tracer uptake in entire inferior myocardium-more prominent on rest images. This is likely due to breast tissue attenuation, with imaging performed in sitting position. Ischemia in this region cannot be excluded. Stress LVEF 66%. Recommend clinical correlation.  PCV ECHOCARDIOGRAM COMPLETE 44/81/8563 Normal LV systolic function with visual EF 60-65%. Left ventricle cavity is normal in size. Normal global wall motion. Normal diastolic filling pattern, normal LAP. Mild (Grade I) aortic regurgitation. Mild tricuspid regurgitation. No evidence of pulmonary hypertension. No prior study for comparison.   EKG:   07/20/2021: Sinus rhythm at a rate of 63 bpm.  Normal axis.  Nonspecific T wave abnormality.  No evidence of ischemia or underlying injury pattern.  Assessment     ICD-10-CM   1. Elevated coronary artery calcium score  R93.1     2. Bilateral leg edema  R60.0     3. Hypercholesteremia  E78.00     4. Primary hypertension  J49 Basic metabolic panel       Medications Discontinued During This Encounter  Medication Reason   mometasone (ELOCON) 0.1 % ointment Error   terbinafine (LAMISIL) 250 MG tablet Error    Meds ordered this encounter  Medications   hydrochlorothiazide (MICROZIDE) 12.5 MG capsule    Sig: Take 1 capsule (12.5  mg total) by mouth daily.    Dispense:  30 capsule    Refill:  3     Recommendations:   [name] is a 65 y.o. female with history of hypertension,  hyperlipidemia, family history of premature CAD, and gestational diabetes, status post hysterectomy in 2009.  Patient is referred to our office for further evaluation and management given elevated coronary calcium score in the 79th percentile.  Presents for follow-up and results of cardiac testing.  Last office visit ordered nuclear stress test given elevated coronary calcium score, stress test was overall low risk. Also last office visit given bilateral leg edema ordered echocardiogram which noted preserved LVEF at 60-65% and mild valvular disease.  Patient was started on Crestor and repeat lipid profile testing showed LDL 82, which is relatively well controlled. however, triglycerirdes were above goal.  Counseled patient regarding diet and lifestyle modifications in order to improve triglyceride profile.  Reviewed and discussed with patient results of echocardiogram and stress test, details above.  Patient is feeling well and asymptomatic.  In regard to hypertension, blood pressure remains uncontrolled.  We will add hydrochlorothiazide 12.5 mg once daily and repeat BMP in 1 week.  Patient will continue to monitor blood pressure and notify our office if it remains >130/80 mmHg.  Recommend continued primary prevention.  Follow-up in 3 months, sooner if needed, for hypertension and hyperlipidemia.   Alethia Berthold, PA-C 09/14/2021, 9:25 AM Office: [phone]

## 2021-09-14 ENCOUNTER — Encounter: Payer: Self-pay | Admitting: Student

## 2021-09-14 ENCOUNTER — Other Ambulatory Visit: Payer: Self-pay

## 2021-09-14 ENCOUNTER — Ambulatory Visit: Payer: Managed Care, Other (non HMO) | Admitting: Student

## 2021-09-14 VITALS — BP 168/90 | HR 56 | Temp 98.0°F | Resp 16 | Ht 61.0 in | Wt 149.0 lb

## 2021-09-14 DIAGNOSIS — R931 Abnormal findings on diagnostic imaging of heart and coronary circulation: Secondary | ICD-10-CM

## 2021-09-14 DIAGNOSIS — E7800 Pure hypercholesterolemia, unspecified: Secondary | ICD-10-CM

## 2021-09-14 DIAGNOSIS — R600 Localized edema: Secondary | ICD-10-CM

## 2021-09-14 DIAGNOSIS — I10 Essential (primary) hypertension: Secondary | ICD-10-CM

## 2021-09-14 MED ORDER — HYDROCHLOROTHIAZIDE 12.5 MG PO CAPS: 12.5000 mg | ORAL_CAPSULE | Freq: Every day | ORAL | 3 refills | Status: DC

## 2021-09-27 LAB — BASIC METABOLIC PANEL
BUN/Creatinine Ratio: 28 (ref 12–28)
BUN: 22 mg/dL (ref 8–27)
CO2: 28 mmol/L (ref 20–29)
Calcium: 9.8 mg/dL (ref 8.7–10.3)
Chloride: 104 mmol/L (ref 96–106)
Creatinine, Ser: 0.8 mg/dL (ref 0.57–1.00)
Glucose: 99 mg/dL (ref 70–99)
Potassium: 4.5 mmol/L (ref 3.5–5.2)
Sodium: 143 mmol/L (ref 134–144)
eGFR: 82 mL/min/{1.73_m2} (ref >59)

## 2021-10-07 ENCOUNTER — Other Ambulatory Visit: Payer: Self-pay

## 2021-10-07 ENCOUNTER — Ambulatory Visit (AMBULATORY_SURGERY_CENTER): Payer: Self-pay | Admitting: *Deleted

## 2021-10-07 VITALS — Ht 61.0 in | Wt 149.0 lb

## 2021-10-07 DIAGNOSIS — Z86010 Personal history of colonic polyps: Secondary | ICD-10-CM

## 2021-10-07 NOTE — Progress Notes (Signed)

## 2021-10-10 ENCOUNTER — Encounter: Payer: Self-pay | Admitting: Internal Medicine

## 2021-10-21 ENCOUNTER — Ambulatory Visit (AMBULATORY_SURGERY_CENTER): Payer: Managed Care, Other (non HMO) | Admitting: Internal Medicine

## 2021-10-21 ENCOUNTER — Other Ambulatory Visit: Payer: Self-pay

## 2021-10-21 ENCOUNTER — Encounter: Payer: Self-pay | Admitting: Internal Medicine

## 2021-10-21 VITALS — BP 139/80 | HR 54 | Temp 98.6°F | Resp 11 | Ht 61.0 in | Wt 149.0 lb

## 2021-10-21 DIAGNOSIS — Z86010 Personal history of colonic polyps: Secondary | ICD-10-CM

## 2021-10-21 DIAGNOSIS — D124 Benign neoplasm of descending colon: Secondary | ICD-10-CM

## 2021-10-21 DIAGNOSIS — D125 Benign neoplasm of sigmoid colon: Secondary | ICD-10-CM

## 2021-10-21 DIAGNOSIS — I10 Essential (primary) hypertension: Secondary | ICD-10-CM | POA: Diagnosis not present

## 2021-10-21 MED ORDER — SODIUM CHLORIDE 0.9 % IV SOLN: 500.0000 mL | Freq: Once | INTRAVENOUS | Status: DC

## 2021-10-21 NOTE — Progress Notes (Signed)
VS completed by CW.   Pt's states no medical or surgical changes since previsit or office visit.  

## 2021-10-21 NOTE — Progress Notes (Signed)
[facility] History and Physical   Primary Care Physician:  Leanna Battles, MD   Reason for Procedure:   Hx colon polyps  Plan:    colonoscopy     HPI: GWENDOLEN HEWLETT is a 65 y.o. 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 SPACE MORTONS NEUROMA EXCISION POSSIBLE HAMMERTOE CORRECTION,LEFT BONIONETTE CORRECTION;  Surgeon: Wylene Simmer, MD;  Location: [facility];  Service: Orthopedics;  Laterality: Left;   KNEE ARTHROSCOPY  2012   Left knee   KNEE ARTHROSCOPY  2011   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 Authorizing Provider  amLODipine (NORVASC) 5 MG tablet Take 5 mg by mouth daily. 06/07/21  Yes [provider]  hydrochlorothiazide (MICROZIDE) 12.5 MG capsule Take 1 capsule (12.5 mg total) by mouth daily. 09/14/21 01/12/22 Yes Cantwell, Celeste C, PA-C  metoprolol succinate (TOPROL-XL) 100 MG 24 hr tablet Take 100 mg by mouth daily. 05/25/21  Yes [provider]  rosuvastatin  (CRESTOR) 20 MG tablet Take 20 mg by mouth daily. 07/18/21  Yes [provider]  ibuprofen (ADVIL) 200 MG tablet Take 200 mg by mouth every 6 (six) hours as needed.    [provider]  Meclizine HCl 25 MG CHEW Chew 1 tablet (25 mg total) by mouth daily as needed. 05/02/21       Current Outpatient Medications  Medication Sig Dispense Refill   amLODipine (NORVASC) 5 MG tablet Take 5 mg by mouth daily.     hydrochlorothiazide (MICROZIDE) 12.5 MG capsule Take 1 capsule (12.5 mg total) by mouth daily. 30 capsule 3   metoprolol succinate (TOPROL-XL) 100 MG 24 hr tablet Take 100 mg by mouth daily.     rosuvastatin (CRESTOR) 20 MG tablet Take 20 mg by mouth daily.     ibuprofen (ADVIL) 200 MG tablet Take 200 mg by mouth every 6 (six) hours as needed.     Meclizine HCl 25 MG CHEW Chew 1 tablet (25 mg total) by mouth daily as needed. 30 tablet 0   Current Facility-Administered Medications  Medication Dose Route Frequency Provider Last Rate Last Admin   0.9 %  sodium chloride infusion  500 mL Intravenous Once Gatha Mayer, MD        Allergies as of 10/21/2021 - Review Complete 10/21/2021  Allergen Reaction Noted   Lisinopril Hives 12/16/2010   Morphine Hives 12/16/2010   Parafon forte dsc [chlorzoxazone] Swelling 11/03/2013    Family  History  Problem Relation Age of Onset   Diabetes Mother    Hypertension Mother    Heart disease Father    Hypertension Father    Hyperlipidemia Father    Diabetes Sister    Diabetes Sister    Diabetes Sister    Colon cancer Neg Hx    Colon polyps Neg Hx    Esophageal cancer Neg Hx    Rectal cancer Neg Hx    Stomach cancer Neg Hx     Past Medical History:  Diagnosis Date   Anemia    Arthritis    fingers   Blood transfusion without reported diagnosis    Borderline hyperlipidemia    Hemorrhoids    Hx of colonic polyps 12/30/2010   Hypertension    Osteoporosis    Past Surgical History:  Procedure Laterality Date   COLONOSCOPY   2017   Dr.Mehtab Dolberry   HAMMER TOE SURGERY Left 02/19/2014   Procedure: LEFT 2-3 WEIL,LEFT 2-3 COLLATERAL LIGAMENT REPAIR,LEFT 3RD WEB SPACE MORTONS NEUROMA EXCISION POSSIBLE HAMMERTOE CORRECTION,LEFT BONIONETTE CORRECTION;  Surgeon: Wylene Simmer, MD;  Location: [place];  Service: Orthopedics;  Laterality: Left;   KNEE ARTHROSCOPY  2012   Left knee   KNEE ARTHROSCOPY  2011   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  Physical Exam: Vital signs BP 126/75    Pulse (!) 59    Temp 98.6 F (37 C) (Temporal)    Ht 5\' 1"  (1.549 m)    Wt 149 lb (67.6 kg)    SpO2 99%    BMI 28.15 kg/m   General:   Alert,  Well-developed, well-nourished, pleasant and cooperative in NAD Lungs:  Clear throughout to auscultation.   Heart:  Regular rate and rhythm; no murmurs, clicks, rubs,  or gallops. Abdomen:  Soft, nontender and nondistended. Normal bowel sounds.   Neuro/Psych:  Alert and cooperative. Normal mood and affect. A and O x 3   @Arihaan Bellucci  Simonne Maffucci, MD, [facility] [phone] (pager) 10/21/2021 9:12 AM@

## 2021-10-21 NOTE — Op Note (Signed)
[place] Patient Name: [name] Procedure Date: 10/21/2021 9:10 AM MRN: [id number] Endoscopist: Gatha Mayer , MD Age: 65 Referring MD:  Date of Birth: 1955-12-29 Gender: Female Account #: [id number] Procedure:                Colonoscopy Indications:              Surveillance: Personal history of adenomatous                            polyps on last colonoscopy 5 years ago Medicines:                Propofol per Anesthesia, Monitored Anesthesia Care Procedure:                Pre-Anesthesia Assessment:                           - Prior to the procedure, a History and Physical                            was performed, and patient medications and                            allergies were reviewed. The patient's tolerance of                            previous anesthesia was also reviewed. The risks                            and benefits of the procedure and the sedation                            options and risks were discussed with the patient.                            All questions were answered, and informed consent                            was obtained. Prior Anticoagulants: The patient has                            taken no previous anticoagulant or antiplatelet                            agents. ASA Grade Assessment: II - A patient with                            mild systemic disease. After reviewing the risks                            and benefits, the patient was deemed in                            satisfactory condition to undergo the procedure.  After obtaining informed consent, the colonoscope                            was passed under direct vision. Throughout the                            procedure, the patient's blood pressure, pulse, and                            oxygen saturations were monitored continuously. The                            Olympus PCF-H190DL (#[id number]) Colonoscope was                             introduced through the anus and advanced to the the                            cecum, identified by appendiceal orifice and                            ileocecal valve. The colonoscopy was performed                            without difficulty. The patient tolerated the                            procedure well. The quality of the bowel                            preparation was good. The ileocecal valve,                            appendiceal orifice, and rectum were photographed.                            The bowel preparation used was Miralax via split                            dose instruction. Scope In: 9:18:08 AM Scope Out: 9:35:34 AM Scope Withdrawal Time: 0 hours 15 minutes 32 seconds  Total Procedure Duration: 0 hours 17 minutes 26 seconds  Findings:                 The perianal and digital rectal examinations were                            normal.                           A 5 mm polyp was found in the sigmoid colon. The                            polyp was sessile. The polyp was removed with a  cold snare. Resection and retrieval were complete.                            Verification of patient identification for the                            specimen was done. Estimated blood loss was minimal.                           A 1 mm polyp was found in the descending colon. The                            polyp was sessile. The polyp was removed with a                            cold biopsy forceps. Resection and retrieval were                            complete. Verification of patient identification                            for the specimen was done. Estimated blood loss was                            minimal.                           Internal hemorrhoids were found.                           The exam was otherwise without abnormality on                            direct and retroflexion views. Complications:            No immediate  complications. Estimated Blood Loss:     Estimated blood loss was minimal. Impression:               - One 5 mm polyp in the sigmoid colon, removed with                            a cold snare. Resected and retrieved.                           - One 1 mm polyp in the descending colon, removed                            with a cold biopsy forceps. Resected and retrieved.                           - Internal hemorrhoids.                           - The examination was otherwise normal on direct  and retroflexion views.                           - Personal history of colonic polyps. 12/2010                            serrated adenoma/polyp and tubular adenoma                           08/24/2016 diminutive sigmoid polyp removed adenoma Recommendation:           - Patient has a contact number available for                            emergencies. The signs and symptoms of potential                            delayed complications were discussed with the                            patient. Return to normal activities tomorrow.                            Written discharge instructions were provided to the                            patient.                           - Resume previous diet.                           - Continue present medications.                           - Repeat colonoscopy is recommended for                            surveillance. The colonoscopy date will be                            determined after pathology results from today's                            exam become available for review. Anticipating 7                            years Gatha Mayer, MD 10/21/2021 9:42:37 AM This report has been signed electronically.

## 2021-10-21 NOTE — Progress Notes (Signed)
Report given to PACU, vss 

## 2021-10-21 NOTE — Progress Notes (Signed)
Called to room to assist during endoscopic procedure.  Patient ID and intended procedure confirmed with present staff. Received instructions for my participation in the procedure from the performing physician.  

## 2021-10-21 NOTE — Patient Instructions (Addendum)
I removed two tiny polyps. I will let you know pathology results and when to have another routine colonoscopy by mail and/or My Chart.  Your hemorrhoids were also a bit swollen (from the prep).  I appreciate the opportunity to care for you. Gatha Mayer, MD, FACG  YOU HAD AN ENDOSCOPIC PROCEDURE TODAY: Refer to the procedure report and other information in the discharge instructions given to you for any specific questions about what was found during the examination. If this information does not answer your questions, please call [facility] office at [phone] to clarify.   YOU SHOULD EXPECT: Some feelings of bloating in the abdomen. Passage of more gas than usual. Walking can help get rid of the air that was put into your GI tract during the procedure and reduce the bloating. If you had a lower endoscopy (such as a colonoscopy or flexible sigmoidoscopy) you may notice spotting of blood in your stool or on the toilet paper. Some abdominal soreness may be present for a day or two, also.  DIET: Your first meal following the procedure should be a light meal and then it is ok to progress to your normal diet. A half-sandwich or bowl of soup is an example of a good first meal. Heavy or fried foods are harder to digest and may make you feel nauseous or bloated. Drink plenty of fluids but you should avoid alcoholic beverages for 24 hours. If you had a esophageal dilation, please see attached instructions for diet.    ACTIVITY: Your care partner should take you home directly after the procedure. You should plan to take it easy, moving slowly for the rest of the day. You can resume normal activity the day after the procedure however YOU SHOULD NOT DRIVE, use power tools, machinery or perform tasks that involve climbing or major physical exertion for 24 hours (because of the sedation medicines used during the test).   SYMPTOMS TO REPORT IMMEDIATELY: A gastroenterologist can be reached at any hour. Please  call [phone]  for any of the following symptoms:  Following lower endoscopy (colonoscopy, flexible sigmoidoscopy) Excessive amounts of blood in the stool  Significant tenderness, worsening of abdominal pains  Swelling of the abdomen that is new, acute  Fever of 100 or higher   FOLLOW UP:  If any biopsies were taken you will be contacted by phone or by letter within the next 1-3 weeks. Call [phone]  if you have not heard about the biopsies in 3 weeks.  Please also call with any specific questions about appointments or follow up tests.

## 2021-10-25 ENCOUNTER — Encounter: Payer: Self-pay | Admitting: Internal Medicine

## 2021-10-25 ENCOUNTER — Telehealth: Payer: Self-pay

## 2021-10-25 NOTE — Telephone Encounter (Signed)
°  Follow up Call-  Call back number 10/21/2021  Post procedure Call Back phone  # [phone]  Permission to leave phone message Yes  Some recent data might be hidden     Patient questions:  Do you have a fever, pain , or abdominal swelling? No. Pain Score  0 *  Have you tolerated food without any problems? Yes.    Have you been able to return to your normal activities? Yes.    Do you have any questions about your discharge instructions: Diet   No. Medications  No. Follow up visit  No.  Do you have questions or concerns about your Care? No.  Actions: * If pain score is 4 or above: No action needed, pain <4.  Have you developed a fever since your procedure? no  2.   Have you had an respiratory symptoms (SOB or cough) since your procedure? no  3.   Have you tested positive for COVID 19 since your procedure no  4.   Have you had any family members/close contacts diagnosed with the COVID 19 since your procedure?  no   If yes to any of these questions please route to Joylene John, RN and Joella Prince, RN

## 2021-12-15 ENCOUNTER — Ambulatory Visit: Payer: Self-pay | Admitting: Student

## 2022-02-01 ENCOUNTER — Other Ambulatory Visit: Payer: Self-pay | Admitting: Student

## 2022-03-19 ENCOUNTER — Other Ambulatory Visit: Payer: Self-pay | Admitting: Student

## 2022-04-24 ENCOUNTER — Other Ambulatory Visit: Payer: Self-pay | Admitting: Student

## 2022-05-01 DIAGNOSIS — E785 Hyperlipidemia, unspecified: Secondary | ICD-10-CM | POA: Diagnosis not present

## 2022-05-01 DIAGNOSIS — M8580 Other specified disorders of bone density and structure, unspecified site: Secondary | ICD-10-CM | POA: Diagnosis not present

## 2022-05-01 DIAGNOSIS — D649 Anemia, unspecified: Secondary | ICD-10-CM | POA: Diagnosis not present

## 2022-05-01 DIAGNOSIS — R7989 Other specified abnormal findings of blood chemistry: Secondary | ICD-10-CM | POA: Diagnosis not present

## 2022-05-06 DIAGNOSIS — Z0000 Encounter for general adult medical examination without abnormal findings: Secondary | ICD-10-CM | POA: Diagnosis not present

## 2022-05-08 DIAGNOSIS — T466X5A Adverse effect of antihyperlipidemic and antiarteriosclerotic drugs, initial encounter: Secondary | ICD-10-CM | POA: Diagnosis not present

## 2022-05-08 DIAGNOSIS — R82998 Other abnormal findings in urine: Secondary | ICD-10-CM | POA: Diagnosis not present

## 2022-05-08 DIAGNOSIS — I10 Essential (primary) hypertension: Secondary | ICD-10-CM | POA: Diagnosis not present

## 2022-05-08 DIAGNOSIS — Z8249 Family history of ischemic heart disease and other diseases of the circulatory system: Secondary | ICD-10-CM | POA: Diagnosis not present

## 2022-05-08 DIAGNOSIS — E785 Hyperlipidemia, unspecified: Secondary | ICD-10-CM | POA: Diagnosis not present

## 2022-05-08 DIAGNOSIS — Z0000 Encounter for general adult medical examination without abnormal findings: Secondary | ICD-10-CM | POA: Diagnosis not present

## 2022-05-15 ENCOUNTER — Other Ambulatory Visit: Payer: Self-pay | Admitting: Student

## 2022-08-19 DIAGNOSIS — Z23 Encounter for immunization: Secondary | ICD-10-CM | POA: Diagnosis not present

## 2022-08-21 ENCOUNTER — Other Ambulatory Visit: Payer: Self-pay | Admitting: Internal Medicine

## 2022-08-21 DIAGNOSIS — Z1231 Encounter for screening mammogram for malignant neoplasm of breast: Secondary | ICD-10-CM

## 2022-10-04 ENCOUNTER — Ambulatory Visit
Admission: RE | Admit: 2022-10-04 | Discharge: 2022-10-04 | Disposition: A | Discharge status: Home / Self Care | Payer: Medicare HMO | Source: Ambulatory Visit | Attending: Internal Medicine | Admitting: Internal Medicine

## 2022-10-04 DIAGNOSIS — Z1231 Encounter for screening mammogram for malignant neoplasm of breast: Secondary | ICD-10-CM | POA: Diagnosis not present

## 2022-10-06 DIAGNOSIS — Z888 Allergy status to other drugs, medicaments and biological substances status: Secondary | ICD-10-CM | POA: Diagnosis not present

## 2022-10-06 DIAGNOSIS — I10 Essential (primary) hypertension: Secondary | ICD-10-CM | POA: Diagnosis not present

## 2022-11-16 DIAGNOSIS — Z1272 Encounter for screening for malignant neoplasm of vagina: Secondary | ICD-10-CM | POA: Diagnosis not present

## 2022-11-16 DIAGNOSIS — Z6825 Body mass index (BMI) 25.0-25.9, adult: Secondary | ICD-10-CM | POA: Diagnosis not present

## 2022-11-16 DIAGNOSIS — M8588 Other specified disorders of bone density and structure, other site: Secondary | ICD-10-CM | POA: Diagnosis not present

## 2022-11-16 DIAGNOSIS — Z01419 Encounter for gynecological examination (general) (routine) without abnormal findings: Secondary | ICD-10-CM | POA: Diagnosis not present

## 2022-12-14 DIAGNOSIS — H5203 Hypermetropia, bilateral: Secondary | ICD-10-CM | POA: Diagnosis not present

## 2022-12-14 DIAGNOSIS — H52203 Unspecified astigmatism, bilateral: Secondary | ICD-10-CM | POA: Diagnosis not present

## 2022-12-14 DIAGNOSIS — H2513 Age-related nuclear cataract, bilateral: Secondary | ICD-10-CM | POA: Diagnosis not present

## 2023-02-22 DIAGNOSIS — E785 Hyperlipidemia, unspecified: Secondary | ICD-10-CM | POA: Diagnosis not present

## 2023-02-22 DIAGNOSIS — M8580 Other specified disorders of bone density and structure, unspecified site: Secondary | ICD-10-CM | POA: Diagnosis not present

## 2023-02-22 DIAGNOSIS — Z008 Encounter for other general examination: Secondary | ICD-10-CM | POA: Diagnosis not present

## 2023-02-22 DIAGNOSIS — Z833 Family history of diabetes mellitus: Secondary | ICD-10-CM | POA: Diagnosis not present

## 2023-02-22 DIAGNOSIS — Z809 Family history of malignant neoplasm, unspecified: Secondary | ICD-10-CM | POA: Diagnosis not present

## 2023-02-22 DIAGNOSIS — Z880 Allergy status to penicillin: Secondary | ICD-10-CM | POA: Diagnosis not present

## 2023-02-22 DIAGNOSIS — Z8249 Family history of ischemic heart disease and other diseases of the circulatory system: Secondary | ICD-10-CM | POA: Diagnosis not present

## 2023-02-22 DIAGNOSIS — I10 Essential (primary) hypertension: Secondary | ICD-10-CM | POA: Diagnosis not present

## 2023-06-18 IMAGING — MG MM DIGITAL SCREENING BILAT W/ TOMO AND CAD
8 series · 8 of 24 positions shown · non-contrast
Comparison: Previous exam(s).

CLINICAL DATA: Screening.

EXAM:
DIGITAL SCREENING BILATERAL MAMMOGRAM WITH TOMOSYNTHESIS AND CAD
TECHNIQUE: Bilateral screening digital craniocaudal and mediolateral oblique
mammograms were obtained. Bilateral screening digital breast
tomosynthesis was performed. The images were evaluated with
computer-aided detection.

[L CC synth-2D]
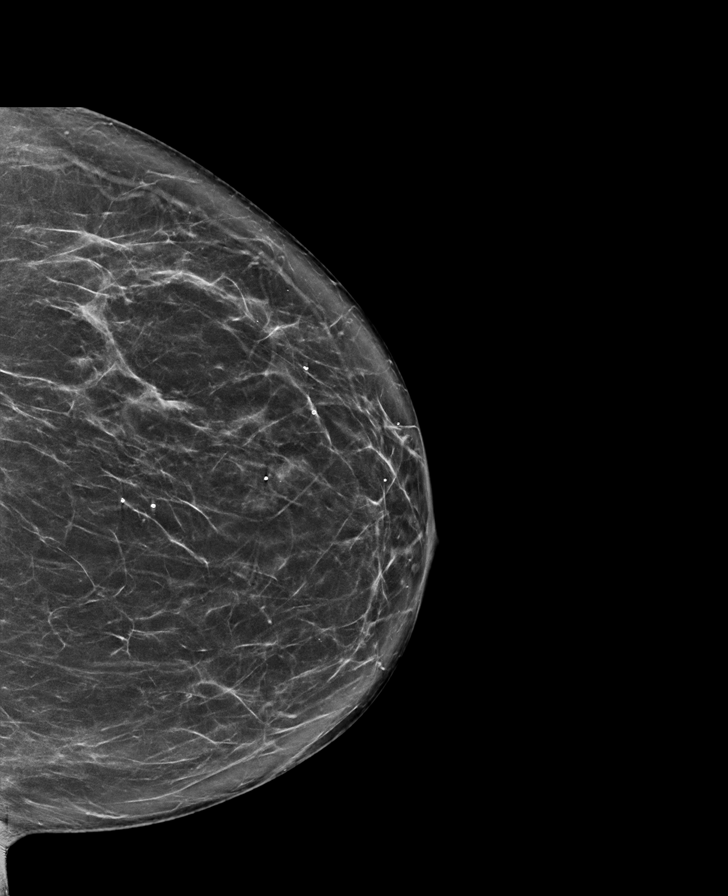

[R MLO synth-2D]
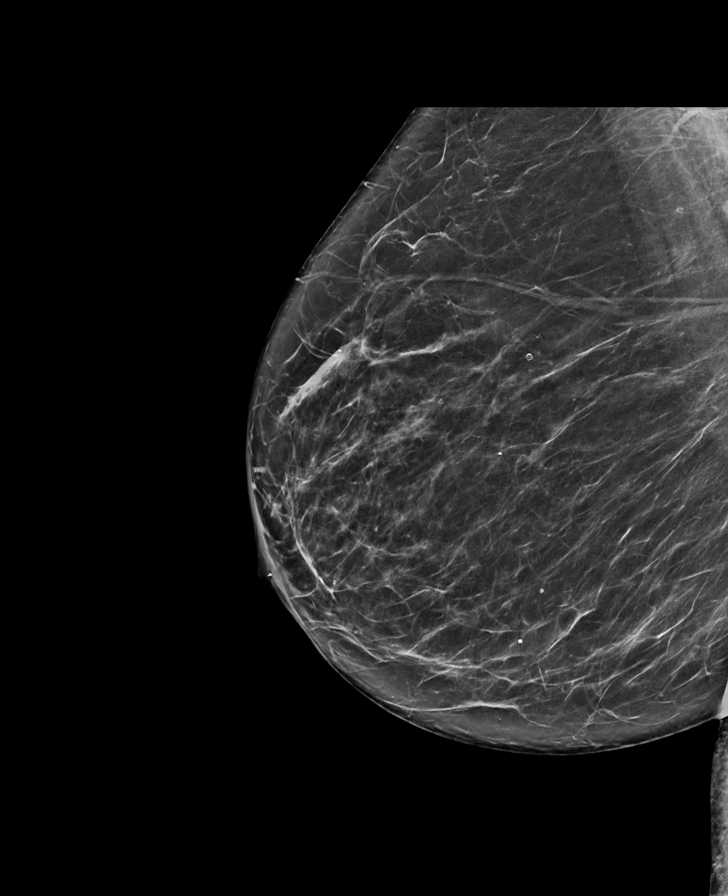

[R CC synth-2D]
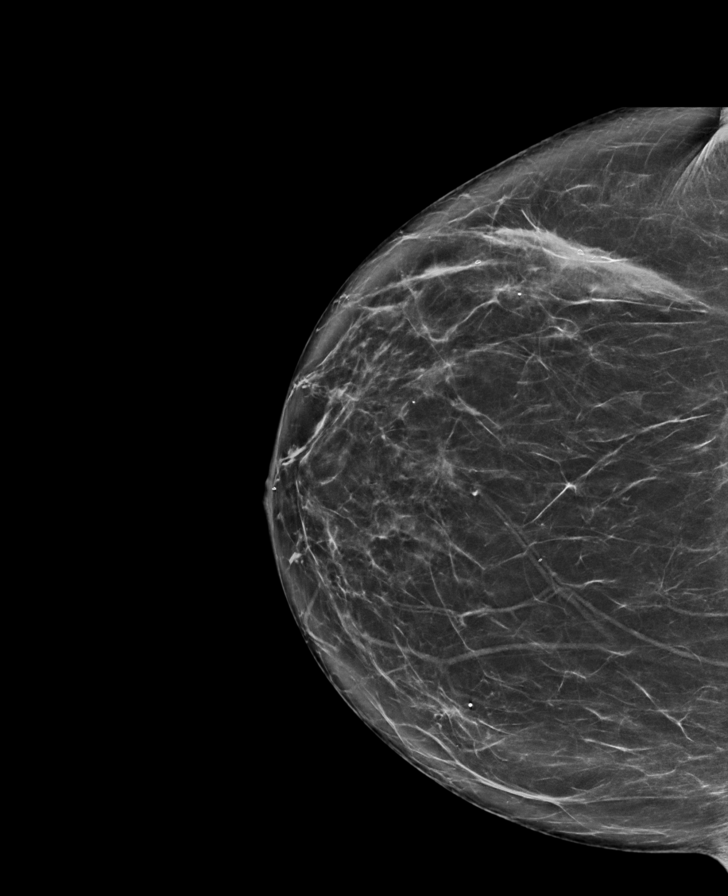

[L MLO synth-2D]
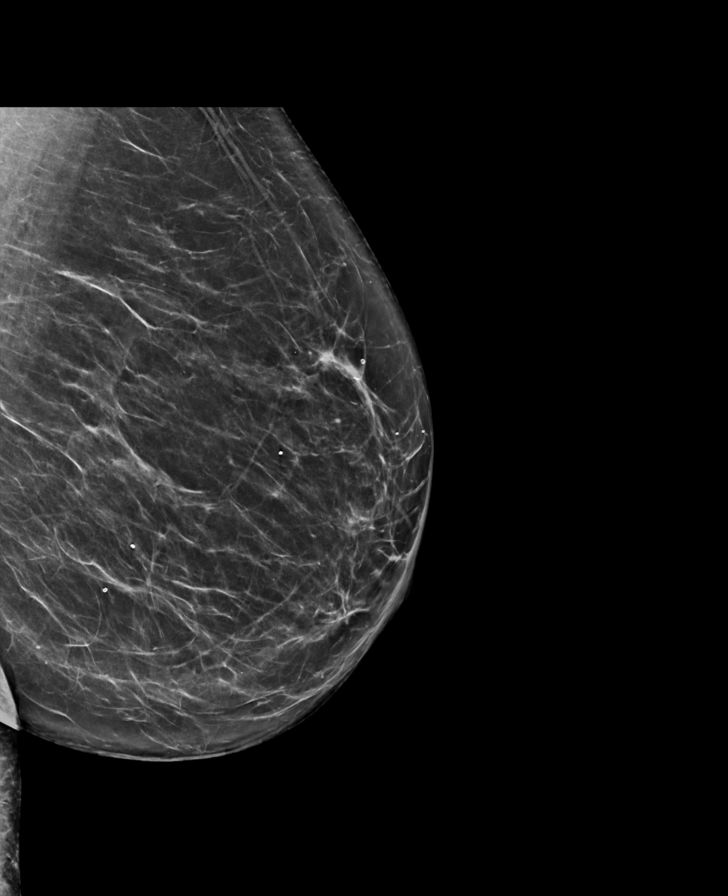

[L CC tomo · tomo slice 41/81.0]
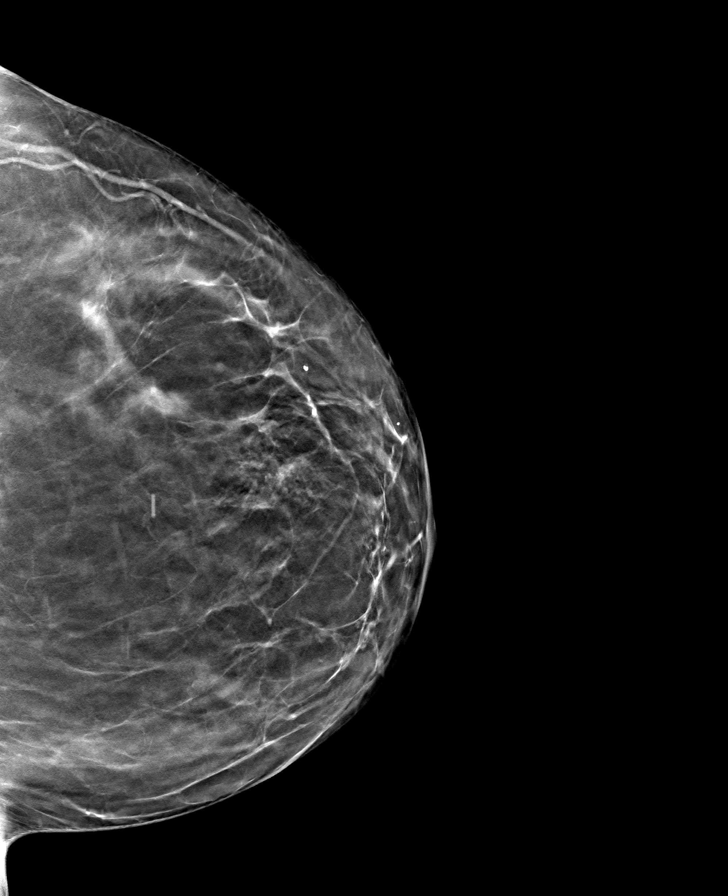

[R CC tomo · tomo slice 39/78.0]
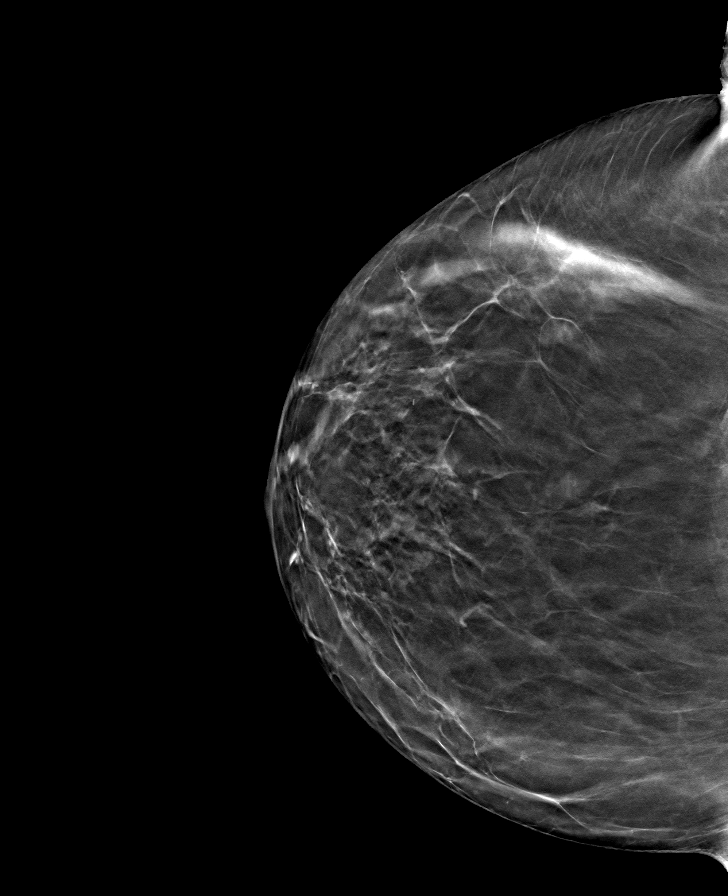

[L MLO tomo · tomo slice 40/79.0]
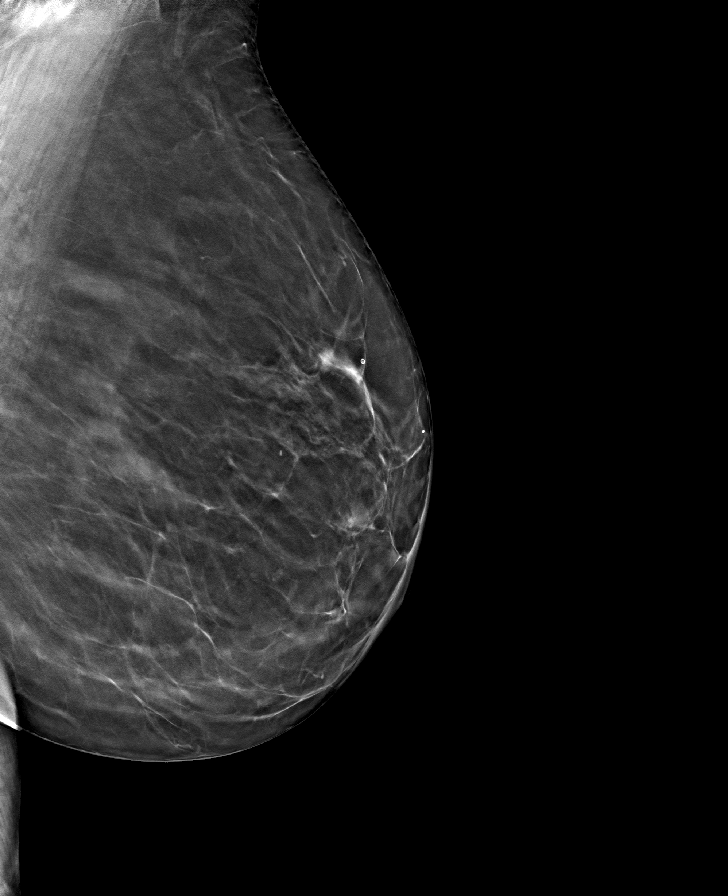

[R MLO tomo · tomo slice 41/80.0]
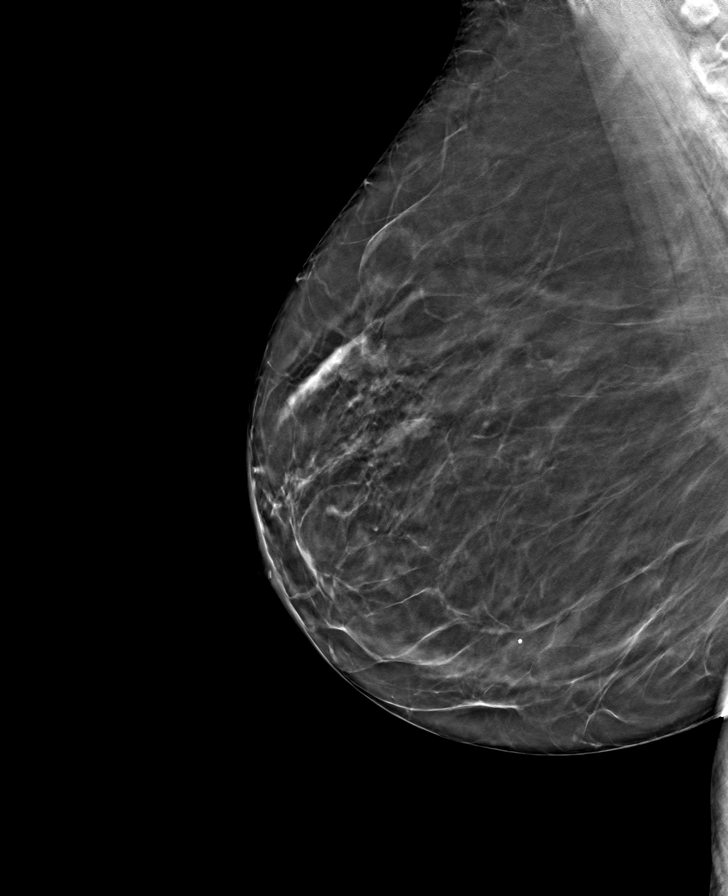

[8 of 24 positions shown; findings below may reference images not displayed]

ACR Breast Density Category b: There are scattered areas of
fibroglandular density.
FINDINGS: There are no findings suspicious for malignancy.
IMPRESSION: No mammographic evidence of malignancy. A result letter of this
screening mammogram will be mailed directly to the patient.

RECOMMENDATION:
Screening mammogram in one year. (Code:51-O-LD2)

BI-RADS CATEGORY  1: Negative.

## 2023-07-26 DIAGNOSIS — E785 Hyperlipidemia, unspecified: Secondary | ICD-10-CM | POA: Diagnosis not present

## 2023-07-26 DIAGNOSIS — D649 Anemia, unspecified: Secondary | ICD-10-CM | POA: Diagnosis not present

## 2023-08-02 DIAGNOSIS — I10 Essential (primary) hypertension: Secondary | ICD-10-CM | POA: Diagnosis not present

## 2023-08-02 DIAGNOSIS — Z0000 Encounter for general adult medical examination without abnormal findings: Secondary | ICD-10-CM | POA: Diagnosis not present

## 2023-08-02 DIAGNOSIS — F5101 Primary insomnia: Secondary | ICD-10-CM | POA: Diagnosis not present

## 2023-08-02 DIAGNOSIS — E785 Hyperlipidemia, unspecified: Secondary | ICD-10-CM | POA: Diagnosis not present

## 2023-08-02 DIAGNOSIS — I700 Atherosclerosis of aorta: Secondary | ICD-10-CM | POA: Diagnosis not present

## 2023-08-02 DIAGNOSIS — I2510 Atherosclerotic heart disease of native coronary artery without angina pectoris: Secondary | ICD-10-CM | POA: Diagnosis not present

## 2023-08-02 DIAGNOSIS — R82998 Other abnormal findings in urine: Secondary | ICD-10-CM | POA: Diagnosis not present

## 2023-09-04 DIAGNOSIS — Z23 Encounter for immunization: Secondary | ICD-10-CM | POA: Diagnosis not present

## 2023-09-06 DIAGNOSIS — Z136 Encounter for screening for cardiovascular disorders: Secondary | ICD-10-CM | POA: Diagnosis not present

## 2023-09-06 DIAGNOSIS — E785 Hyperlipidemia, unspecified: Secondary | ICD-10-CM | POA: Diagnosis not present

## 2023-09-06 DIAGNOSIS — E559 Vitamin D deficiency, unspecified: Secondary | ICD-10-CM | POA: Diagnosis not present

## 2023-09-06 DIAGNOSIS — R799 Abnormal finding of blood chemistry, unspecified: Secondary | ICD-10-CM | POA: Diagnosis not present

## 2023-09-06 DIAGNOSIS — Z131 Encounter for screening for diabetes mellitus: Secondary | ICD-10-CM | POA: Diagnosis not present

## 2023-09-06 DIAGNOSIS — I2510 Atherosclerotic heart disease of native coronary artery without angina pectoris: Secondary | ICD-10-CM | POA: Diagnosis not present

## 2023-09-06 DIAGNOSIS — Z79899 Other long term (current) drug therapy: Secondary | ICD-10-CM | POA: Diagnosis not present

## 2023-09-06 DIAGNOSIS — I10 Essential (primary) hypertension: Secondary | ICD-10-CM | POA: Diagnosis not present

## 2023-09-06 DIAGNOSIS — R7989 Other specified abnormal findings of blood chemistry: Secondary | ICD-10-CM | POA: Diagnosis not present

## 2023-09-24 ENCOUNTER — Other Ambulatory Visit: Payer: Self-pay | Admitting: Internal Medicine

## 2023-09-24 DIAGNOSIS — Z1231 Encounter for screening mammogram for malignant neoplasm of breast: Secondary | ICD-10-CM

## 2023-10-02 ENCOUNTER — Other Ambulatory Visit (HOSPITAL_COMMUNITY): Payer: Self-pay | Admitting: Occupational Medicine

## 2023-10-02 DIAGNOSIS — I2510 Atherosclerotic heart disease of native coronary artery without angina pectoris: Secondary | ICD-10-CM

## 2023-10-02 DIAGNOSIS — I10 Essential (primary) hypertension: Secondary | ICD-10-CM

## 2023-10-02 DIAGNOSIS — E785 Hyperlipidemia, unspecified: Secondary | ICD-10-CM

## 2023-10-15 ENCOUNTER — Encounter (HOSPITAL_COMMUNITY): Payer: Self-pay

## 2023-10-16 ENCOUNTER — Telehealth (HOSPITAL_COMMUNITY): Payer: Self-pay | Admitting: *Deleted

## 2023-10-16 NOTE — Telephone Encounter (Signed)
Attempted to call patient regarding upcoming cardiac CT appointment. Left message on voicemail with name and callback number Hayley Sharpe RN Navigator Cardiac Imaging [facility] [phone] Office   

## 2023-10-16 NOTE — Telephone Encounter (Signed)
Received call from patient regarding upcoming cardiac imaging study; pt verbalizes understanding of appt date/time, parking situation and where to check in, pre-test NPO status and medications ordered, and verified current allergies; name and call back number provided for further questions should they arise Johney Frame RN Navigator Cardiac Imaging [facility] [phone] office [phone] cell  Patient reports HR 60s, denies contrast allergy.

## 2023-10-17 ENCOUNTER — Ambulatory Visit (HOSPITAL_COMMUNITY)
Admission: RE | Admit: 2023-10-17 | Discharge: 2023-10-17 | Disposition: A | Discharge status: Home / Self Care | Payer: Medicare HMO | Source: Ambulatory Visit | Attending: Occupational Medicine | Admitting: Occupational Medicine

## 2023-10-17 DIAGNOSIS — E785 Hyperlipidemia, unspecified: Secondary | ICD-10-CM | POA: Insufficient documentation

## 2023-10-17 DIAGNOSIS — I2510 Atherosclerotic heart disease of native coronary artery without angina pectoris: Secondary | ICD-10-CM | POA: Diagnosis present

## 2023-10-17 DIAGNOSIS — I10 Essential (primary) hypertension: Secondary | ICD-10-CM | POA: Insufficient documentation

## 2023-10-17 DIAGNOSIS — I700 Atherosclerosis of aorta: Secondary | ICD-10-CM | POA: Diagnosis not present

## 2023-10-17 MED ORDER — NITROGLYCERIN 0.4 MG SL SUBL
SUBLINGUAL_TABLET | SUBLINGUAL | Status: AC
  Filled 2023-10-17: qty 2

## 2023-10-17 MED ORDER — IOHEXOL 350 MG/ML SOLN
95.0000 mL | Freq: Once | INTRAVENOUS | Status: AC | PRN
  Administered 2023-10-17: 95 mL via INTRAVENOUS

## 2023-10-17 MED ORDER — NITROGLYCERIN 0.4 MG SL SUBL
0.8000 mg | SUBLINGUAL_TABLET | Freq: Once | SUBLINGUAL | Status: AC
  Administered 2023-10-17: 0.8 mg via SUBLINGUAL

## 2023-10-18 ENCOUNTER — Ambulatory Visit
Admission: RE | Admit: 2023-10-18 | Discharge: 2023-10-18 | Disposition: A | Discharge status: Home / Self Care | Payer: Medicare HMO | Source: Ambulatory Visit

## 2023-10-18 DIAGNOSIS — Z1231 Encounter for screening mammogram for malignant neoplasm of breast: Secondary | ICD-10-CM

## 2023-12-18 DIAGNOSIS — H52203 Unspecified astigmatism, bilateral: Secondary | ICD-10-CM | POA: Diagnosis not present

## 2023-12-18 DIAGNOSIS — H2513 Age-related nuclear cataract, bilateral: Secondary | ICD-10-CM | POA: Diagnosis not present

## 2023-12-18 DIAGNOSIS — H5203 Hypermetropia, bilateral: Secondary | ICD-10-CM | POA: Diagnosis not present

## 2024-08-04 DIAGNOSIS — I10 Essential (primary) hypertension: Secondary | ICD-10-CM | POA: Diagnosis not present

## 2024-08-04 DIAGNOSIS — I2510 Atherosclerotic heart disease of native coronary artery without angina pectoris: Secondary | ICD-10-CM | POA: Diagnosis not present

## 2024-08-04 DIAGNOSIS — E785 Hyperlipidemia, unspecified: Secondary | ICD-10-CM | POA: Diagnosis not present

## 2024-08-11 DIAGNOSIS — M8580 Other specified disorders of bone density and structure, unspecified site: Secondary | ICD-10-CM | POA: Diagnosis not present

## 2024-08-11 DIAGNOSIS — Z1212 Encounter for screening for malignant neoplasm of rectum: Secondary | ICD-10-CM | POA: Diagnosis not present

## 2024-08-11 DIAGNOSIS — R82998 Other abnormal findings in urine: Secondary | ICD-10-CM | POA: Diagnosis not present

## 2024-08-11 DIAGNOSIS — D649 Anemia, unspecified: Secondary | ICD-10-CM | POA: Diagnosis not present

## 2024-09-24 ENCOUNTER — Other Ambulatory Visit: Payer: Self-pay | Admitting: Internal Medicine

## 2024-09-24 DIAGNOSIS — Z1231 Encounter for screening mammogram for malignant neoplasm of breast: Secondary | ICD-10-CM

## 2024-10-21 ENCOUNTER — Inpatient Hospital Stay: Admission: RE | Admit: 2024-10-21 | Discharge: 2024-10-21

## 2024-10-21 DIAGNOSIS — Z1231 Encounter for screening mammogram for malignant neoplasm of breast: Secondary | ICD-10-CM

## 2024-10-27 ENCOUNTER — Other Ambulatory Visit: Payer: Self-pay | Admitting: Internal Medicine

## 2024-10-27 DIAGNOSIS — R928 Other abnormal and inconclusive findings on diagnostic imaging of breast: Secondary | ICD-10-CM

## 2024-11-12 ENCOUNTER — Ambulatory Visit
Admission: RE | Admit: 2024-11-12 | Discharge: 2024-11-12 | Disposition: A | Discharge status: Home / Self Care | Source: Ambulatory Visit | Attending: Internal Medicine | Admitting: Internal Medicine

## 2024-11-12 ENCOUNTER — Ambulatory Visit

## 2024-11-12 DIAGNOSIS — R928 Other abnormal and inconclusive findings on diagnostic imaging of breast: Secondary | ICD-10-CM
